# Patient Record
Sex: Female | Born: 1950 | Race: White | Hispanic: No | Marital: Married | Smoking: Never smoker
Health system: Southern US, Community
[De-identification: ages and names within clinical notes are randomized; demographics above are authoritative.]

## PROBLEM LIST (undated history)

## (undated) DIAGNOSIS — K219 Gastro-esophageal reflux disease without esophagitis: Secondary | ICD-10-CM

## (undated) DIAGNOSIS — E039 Hypothyroidism, unspecified: Secondary | ICD-10-CM

## (undated) DIAGNOSIS — E785 Hyperlipidemia, unspecified: Secondary | ICD-10-CM

## (undated) HISTORY — PX: TONSILLECTOMY: SUR1361

## (undated) HISTORY — PX: BREAST BIOPSY: SHX20

---

## 2002-11-27 ENCOUNTER — Other Ambulatory Visit: Admission: RE | Admit: 2002-11-27 | Discharge: 2002-11-27 | Payer: Self-pay | Admitting: *Deleted

## 2003-12-16 ENCOUNTER — Other Ambulatory Visit: Admission: RE | Admit: 2003-12-16 | Discharge: 2003-12-16 | Payer: Self-pay | Admitting: Obstetrics and Gynecology

## 2003-12-22 ENCOUNTER — Encounter: Admission: RE | Admit: 2003-12-22 | Discharge: 2003-12-22 | Payer: Self-pay | Admitting: Obstetrics and Gynecology

## 2004-12-27 ENCOUNTER — Other Ambulatory Visit: Admission: RE | Admit: 2004-12-27 | Discharge: 2004-12-27 | Payer: Self-pay | Admitting: Obstetrics and Gynecology

## 2004-12-28 ENCOUNTER — Encounter: Admission: RE | Admit: 2004-12-28 | Discharge: 2004-12-28 | Payer: Self-pay | Admitting: Obstetrics and Gynecology

## 2006-02-14 ENCOUNTER — Encounter: Admission: RE | Admit: 2006-02-14 | Discharge: 2006-02-14 | Payer: Self-pay | Admitting: Obstetrics and Gynecology

## 2007-02-28 ENCOUNTER — Encounter: Admission: RE | Admit: 2007-02-28 | Discharge: 2007-02-28 | Payer: Self-pay | Admitting: Obstetrics and Gynecology

## 2008-05-04 ENCOUNTER — Encounter: Admission: RE | Admit: 2008-05-04 | Discharge: 2008-05-04 | Payer: Self-pay | Admitting: Obstetrics and Gynecology

## 2009-06-10 ENCOUNTER — Encounter: Admission: RE | Admit: 2009-06-10 | Discharge: 2009-06-10 | Payer: Self-pay | Admitting: Obstetrics and Gynecology

## 2010-06-16 ENCOUNTER — Encounter: Admission: RE | Admit: 2010-06-16 | Discharge: 2010-06-16 | Payer: Self-pay | Admitting: Obstetrics and Gynecology

## 2010-10-16 ENCOUNTER — Encounter: Payer: Self-pay | Admitting: Obstetrics and Gynecology

## 2011-07-31 ENCOUNTER — Other Ambulatory Visit: Payer: Self-pay | Admitting: Family Medicine

## 2011-07-31 DIAGNOSIS — Z1231 Encounter for screening mammogram for malignant neoplasm of breast: Secondary | ICD-10-CM

## 2011-08-28 ENCOUNTER — Ambulatory Visit
Admission: RE | Admit: 2011-08-28 | Discharge: 2011-08-28 | Disposition: A | Discharge status: Home / Self Care | Payer: BC Managed Care – PPO | Source: Ambulatory Visit | Attending: Family Medicine | Admitting: Family Medicine

## 2011-08-28 DIAGNOSIS — Z1231 Encounter for screening mammogram for malignant neoplasm of breast: Secondary | ICD-10-CM

## 2012-09-11 ENCOUNTER — Other Ambulatory Visit: Payer: Self-pay | Admitting: Family Medicine

## 2012-09-11 DIAGNOSIS — Z1231 Encounter for screening mammogram for malignant neoplasm of breast: Secondary | ICD-10-CM

## 2012-10-14 ENCOUNTER — Ambulatory Visit
Admission: RE | Admit: 2012-10-14 | Discharge: 2012-10-14 | Disposition: A | Discharge status: Home / Self Care | Payer: BC Managed Care – PPO | Source: Ambulatory Visit | Attending: Family Medicine | Admitting: Family Medicine

## 2012-10-14 DIAGNOSIS — Z1231 Encounter for screening mammogram for malignant neoplasm of breast: Secondary | ICD-10-CM

## 2013-12-17 ENCOUNTER — Other Ambulatory Visit: Payer: Self-pay

## 2013-12-17 DIAGNOSIS — Z1231 Encounter for screening mammogram for malignant neoplasm of breast: Secondary | ICD-10-CM

## 2013-12-25 ENCOUNTER — Ambulatory Visit
Admission: RE | Admit: 2013-12-25 | Discharge: 2013-12-25 | Disposition: A | Discharge status: Home / Self Care | Payer: BC Managed Care – PPO | Source: Ambulatory Visit

## 2013-12-25 DIAGNOSIS — Z1231 Encounter for screening mammogram for malignant neoplasm of breast: Secondary | ICD-10-CM

## 2014-06-16 ENCOUNTER — Other Ambulatory Visit: Payer: Self-pay | Admitting: Endocrinology

## 2014-06-16 DIAGNOSIS — H61892 Other specified disorders of left external ear: Secondary | ICD-10-CM

## 2014-06-19 ENCOUNTER — Ambulatory Visit
Admission: RE | Admit: 2014-06-19 | Discharge: 2014-06-19 | Disposition: A | Discharge status: Home / Self Care | Payer: BC Managed Care – PPO | Source: Ambulatory Visit | Attending: Endocrinology | Admitting: Endocrinology

## 2014-06-19 DIAGNOSIS — H61892 Other specified disorders of left external ear: Secondary | ICD-10-CM

## 2014-06-24 ENCOUNTER — Other Ambulatory Visit: Payer: Self-pay | Admitting: Otolaryngology

## 2014-06-24 DIAGNOSIS — D490 Neoplasm of unspecified behavior of digestive system: Secondary | ICD-10-CM

## 2014-06-30 ENCOUNTER — Ambulatory Visit
Admission: RE | Admit: 2014-06-30 | Discharge: 2014-06-30 | Disposition: A | Discharge status: Home / Self Care | Payer: BC Managed Care – PPO | Source: Ambulatory Visit | Attending: Otolaryngology | Admitting: Otolaryngology

## 2014-06-30 DIAGNOSIS — D490 Neoplasm of unspecified behavior of digestive system: Secondary | ICD-10-CM

## 2014-06-30 MED ORDER — IOHEXOL 300 MG/ML  SOLN
75.0000 mL | Freq: Once | INTRAMUSCULAR | Status: AC | PRN
  Administered 2014-06-30: 75 mL via INTRAVENOUS

## 2014-09-28 ENCOUNTER — Inpatient Hospital Stay (HOSPITAL_COMMUNITY): Admission: RE | Admit: 2014-09-28 | Payer: BC Managed Care – PPO | Source: Ambulatory Visit

## 2014-10-17 NOTE — Pre-Procedure Instructions (Signed)
TENEISHA GIGNAC  10/17/2014   Your procedure is scheduled on:  January 29  Report to [facility] Admitting at 08:15 AM.  Call this number if you have problems the morning of surgery: [phone]   Remember:   Do not eat food or drink liquids after midnight.   Take these medicines the morning of surgery with A SIP OF WATER: Levothyroxine, Cabergoline (if due)   STOP Aspirin, Calcium, Vitamin D, Vitamin B12, Multiple Vitamins, Fish Oil, Zinc today   STOP/ Do not take Aspirin, Aleve, Naproxen, Advil, Ibuprofen, Motrin, Vitamins, Herbs, or Supplements starting today   Do not wear jewelry, make-up or nail polish.  Do not wear lotions, powders, or perfumes. You may wear deodorant.  Do not shave 48 hours prior to surgery. Men may shave face and neck.  Do not bring valuables to the hospital.  [facility] is not responsible for any belongings or valuables.               Contacts, dentures or bridgework may not be worn into surgery.  Leave suitcase in the car. After surgery it may be brought to your room.  For patients admitted to the hospital, discharge time is determined by your treatment team.               Patients discharged the day of surgery will not be allowed to drive home.    Special Instructions: [facility] - Preparing for Surgery  Before surgery, you can play an important role.  Because skin is not sterile, your skin needs to be as free of germs as possible.  You can reduce the number of germs on you skin by washing with CHG (chlorahexidine gluconate) soap before surgery.  CHG is an antiseptic cleaner which kills germs and bonds with the skin to continue killing germs even after washing.  Please DO NOT use if you have an allergy to CHG or antibacterial soaps.  If your skin becomes reddened/irritated stop using the CHG and inform your nurse when you arrive at Short Stay.  Do not shave (including legs and underarms) for at least 48 hours prior to the first CHG shower.   You may shave your face.  Please follow these instructions carefully:   1.  Shower with CHG Soap the night before surgery and the morning of Surgery.  2.  If you choose to wash your hair, wash your hair first as usual with your normal shampoo.  3.  After you shampoo, rinse your hair and body thoroughly to remove the shampoo.  4.  Use CHG as you would any other liquid soap.  You can apply CHG directly to the skin and wash gently with scrungie or a clean washcloth.  5.  Apply the CHG Soap to your body ONLY FROM THE NECK DOWN.  Do not use on open wounds or open sores.  Avoid contact with your eyes, ears, mouth and genitals (private parts).  Wash genitals (private parts) with your normal soap.  6.  Wash thoroughly, paying special attention to the area where your surgery will be performed.  7.  Thoroughly rinse your body with warm water from the neck down.  8.  DO NOT shower/wash with your normal soap after using and rinsing off the CHG Soap.  9.  Pat yourself dry with a clean towel.            10.  Wear clean pajamas.  11.  Place clean sheets on your bed the night of your first shower and do not sleep with pets.  Day of Surgery  Do not apply any lotions the morning of surgery.  Please wear clean clothes to the hospital/surgery center.     Please read over the following fact sheets that you were given: Pain Booklet, Coughing and Deep Breathing and Surgical Site Infection Prevention

## 2014-10-19 ENCOUNTER — Encounter (HOSPITAL_COMMUNITY)
Admission: RE | Admit: 2014-10-19 | Discharge: 2014-10-19 | Disposition: A | Discharge status: Home / Self Care | Payer: 59 | Source: Ambulatory Visit | Attending: Otolaryngology | Admitting: Otolaryngology

## 2014-10-19 ENCOUNTER — Encounter (HOSPITAL_COMMUNITY): Payer: Self-pay

## 2014-10-19 DIAGNOSIS — Z01812 Encounter for preprocedural laboratory examination: Secondary | ICD-10-CM | POA: Insufficient documentation

## 2014-10-19 DIAGNOSIS — D490 Neoplasm of unspecified behavior of digestive system: Secondary | ICD-10-CM | POA: Diagnosis not present

## 2014-10-19 HISTORY — DX: Hyperlipidemia, unspecified: E78.5

## 2014-10-19 HISTORY — DX: Hypothyroidism, unspecified: E03.9

## 2014-10-19 HISTORY — DX: Gastro-esophageal reflux disease without esophagitis: K21.9

## 2014-10-19 LAB — CBC
HEMATOCRIT: 43 % (ref 36.0–46.0)
Hemoglobin: 14 g/dL (ref 12.0–15.0)
MCH: 30.1 pg (ref 26.0–34.0)
MCHC: 32.6 g/dL (ref 30.0–36.0)
MCV: 92.5 fL (ref 78.0–100.0)
Platelets: 202 10*3/uL (ref 150–400)
RBC: 4.65 MIL/uL (ref 3.87–5.11)
RDW: 12.2 % (ref 11.5–15.5)
WBC: 4.7 10*3/uL (ref 4.0–10.5)

## 2014-10-22 NOTE — Anesthesia Preprocedure Evaluation (Addendum)
Anesthesia Evaluation  Patient identified by MRN, date of birth, ID band Patient awake    Reviewed: Allergy & Precautions, NPO status , Patient's Chart, lab work & pertinent test results, reviewed documented beta blocker date and time   Airway Mallampati: II   Neck ROM: Full    Dental  (+) Teeth Intact, Dental Advisory Given   Pulmonary neg pulmonary ROS,  breath sounds clear to auscultation        Cardiovascular negative cardio ROS  Rhythm:Regular     Neuro/Psych negative neurological ROS     GI/Hepatic Neg liver ROS, GERD-  Medicated,  Endo/Other  Hypothyroidism (replacement)   Renal/GU      Musculoskeletal   Abdominal (+)  Abdomen: soft.    Peds  Hematology   Anesthesia Other Findings   Reproductive/Obstetrics                           Anesthesia Physical Anesthesia Plan  ASA: II  Anesthesia Plan: General   Post-op Pain Management:    Induction: Intravenous  Airway Management Planned: Oral ETT  Additional Equipment:   Intra-op Plan:   Post-operative Plan: Extubation in OR  Informed Consent: I have reviewed the patients History and Physical, chart, labs and discussed the procedure including the risks, benefits and alternatives for the proposed anesthesia with the patient or authorized representative who has indicated his/her understanding and acceptance.     Plan Discussed with:   Anesthesia Plan Comments:         Anesthesia Quick Evaluation

## 2014-10-23 ENCOUNTER — Encounter (HOSPITAL_COMMUNITY)
Admission: RE | Disposition: A | Discharge status: Home / Self Care | Payer: Self-pay | Source: Ambulatory Visit | Attending: Otolaryngology

## 2014-10-23 ENCOUNTER — Ambulatory Visit (HOSPITAL_COMMUNITY): Payer: 59 | Admitting: Anesthesiology

## 2014-10-23 ENCOUNTER — Encounter (HOSPITAL_COMMUNITY): Payer: Self-pay | Admitting: Anesthesiology

## 2014-10-23 ENCOUNTER — Ambulatory Visit (HOSPITAL_COMMUNITY)
Admission: RE | Admit: 2014-10-23 | Discharge: 2014-10-24 | Disposition: A | Discharge status: Home / Self Care | Payer: 59 | Source: Ambulatory Visit | Attending: Otolaryngology | Admitting: Otolaryngology

## 2014-10-23 DIAGNOSIS — K219 Gastro-esophageal reflux disease without esophagitis: Secondary | ICD-10-CM | POA: Insufficient documentation

## 2014-10-23 DIAGNOSIS — K118 Other diseases of salivary glands: Secondary | ICD-10-CM | POA: Diagnosis present

## 2014-10-23 DIAGNOSIS — Z7982 Long term (current) use of aspirin: Secondary | ICD-10-CM | POA: Diagnosis not present

## 2014-10-23 DIAGNOSIS — E039 Hypothyroidism, unspecified: Secondary | ICD-10-CM | POA: Diagnosis not present

## 2014-10-23 DIAGNOSIS — Y838 Other surgical procedures as the cause of abnormal reaction of the patient, or of later complication, without mention of misadventure at the time of the procedure: Secondary | ICD-10-CM | POA: Insufficient documentation

## 2014-10-23 DIAGNOSIS — K9161 Intraoperative hemorrhage and hematoma of a digestive system organ or structure complicating a digestive sytem procedure: Secondary | ICD-10-CM | POA: Insufficient documentation

## 2014-10-23 DIAGNOSIS — E785 Hyperlipidemia, unspecified: Secondary | ICD-10-CM | POA: Insufficient documentation

## 2014-10-23 HISTORY — PX: HEMATOMA EVACUATION: SHX5118

## 2014-10-23 HISTORY — PX: PAROTIDECTOMY: SHX2163

## 2014-10-23 SURGERY — EVACUATION HEMATOMA
Anesthesia: General | Site: Neck | Laterality: Right

## 2014-10-23 SURGERY — EXCISION, PAROTID GLAND
Anesthesia: General | Site: Face | Laterality: Right

## 2014-10-23 MED ORDER — DEXTROSE IN LACTATED RINGERS 5 % IV SOLN
INTRAVENOUS | Status: DC
  Administered 2014-10-23: 20:00:00 via INTRAVENOUS

## 2014-10-23 MED ORDER — ZOLPIDEM TARTRATE 5 MG PO TABS: 5.0000 mg | ORAL_TABLET | Freq: Every evening | ORAL | Status: DC | PRN

## 2014-10-23 MED ORDER — ARTIFICIAL TEARS OP OINT
TOPICAL_OINTMENT | OPHTHALMIC | Status: AC
  Filled 2014-10-23: qty 3.5

## 2014-10-23 MED ORDER — PHENYLEPHRINE HCL 10 MG/ML IJ SOLN
INTRAMUSCULAR | Status: DC | PRN
  Administered 2014-10-23 (×3): 80 ug via INTRAVENOUS

## 2014-10-23 MED ORDER — SUCCINYLCHOLINE CHLORIDE 20 MG/ML IJ SOLN
INTRAMUSCULAR | Status: DC | PRN
  Administered 2014-10-23: 100 mg via INTRAVENOUS

## 2014-10-23 MED ORDER — CEFAZOLIN SODIUM-DEXTROSE 2-3 GM-% IV SOLR
2.0000 g | Freq: Three times a day (TID) | INTRAVENOUS | Status: AC
  Administered 2014-10-23 – 2014-10-24 (×2): 2 g via INTRAVENOUS
  Filled 2014-10-23 (×4): qty 50

## 2014-10-23 MED ORDER — ROCURONIUM BROMIDE 50 MG/5ML IV SOLN
INTRAVENOUS | Status: AC
  Filled 2014-10-23: qty 1

## 2014-10-23 MED ORDER — GLYCOPYRROLATE 0.2 MG/ML IJ SOLN
INTRAMUSCULAR | Status: DC | PRN
  Administered 2014-10-23: 0.4 mg via INTRAVENOUS
  Administered 2014-10-23: 0.2 mg via INTRAVENOUS

## 2014-10-23 MED ORDER — LIDOCAINE-EPINEPHRINE 1 %-1:100000 IJ SOLN
INTRAMUSCULAR | Status: AC
  Filled 2014-10-23: qty 1

## 2014-10-23 MED ORDER — ONDANSETRON HCL 4 MG PO TABS: 4.0000 mg | ORAL_TABLET | ORAL | Status: DC | PRN

## 2014-10-23 MED ORDER — HYDROCODONE-ACETAMINOPHEN 5-325 MG PO TABS: 1.0000 | ORAL_TABLET | Freq: Four times a day (QID) | ORAL | Status: DC | PRN

## 2014-10-23 MED ORDER — FENTANYL CITRATE 0.05 MG/ML IJ SOLN
INTRAMUSCULAR | Status: DC | PRN
  Administered 2014-10-23 (×5): 50 ug via INTRAVENOUS

## 2014-10-23 MED ORDER — HYDROCODONE-ACETAMINOPHEN 5-325 MG PO TABS
ORAL_TABLET | ORAL | Status: AC
  Filled 2014-10-23: qty 2

## 2014-10-23 MED ORDER — 0.9 % SODIUM CHLORIDE (POUR BTL) OPTIME
TOPICAL | Status: DC | PRN
  Administered 2014-10-23: 1000 mL

## 2014-10-23 MED ORDER — PROMETHAZINE HCL 25 MG/ML IJ SOLN: 12.5000 mg | Freq: Three times a day (TID) | INTRAMUSCULAR | Status: DC | PRN

## 2014-10-23 MED ORDER — LIDOCAINE HCL (CARDIAC) 20 MG/ML IV SOLN
INTRAVENOUS | Status: AC
  Filled 2014-10-23: qty 5

## 2014-10-23 MED ORDER — PHENYLEPHRINE HCL 10 MG/ML IJ SOLN
10.0000 mg | INTRAVENOUS | Status: DC | PRN
  Administered 2014-10-23: 30 ug/min via INTRAVENOUS

## 2014-10-23 MED ORDER — LEVOTHYROXINE SODIUM 50 MCG PO TABS
50.0000 ug | ORAL_TABLET | Freq: Every day | ORAL | Status: DC
  Administered 2014-10-24: 50 ug via ORAL
  Filled 2014-10-23 (×2): qty 1

## 2014-10-23 MED ORDER — MORPHINE SULFATE 2 MG/ML IJ SOLN: 2.0000 mg | INTRAMUSCULAR | Status: DC | PRN

## 2014-10-23 MED ORDER — CABERGOLINE 0.5 MG PO TABS: 0.5000 mg | ORAL_TABLET | ORAL | Status: DC

## 2014-10-23 MED ORDER — FENTANYL CITRATE 0.05 MG/ML IJ SOLN
INTRAMUSCULAR | Status: AC
  Filled 2014-10-23: qty 5

## 2014-10-23 MED ORDER — GLYCOPYRROLATE 0.2 MG/ML IJ SOLN
INTRAMUSCULAR | Status: AC
  Filled 2014-10-23: qty 6

## 2014-10-23 MED ORDER — HEPARIN SODIUM (PORCINE) 5000 UNIT/ML IJ SOLN: 5000.0000 [IU] | Freq: Three times a day (TID) | INTRAMUSCULAR | Status: DC

## 2014-10-23 MED ORDER — CALCIUM CITRATE-VITAMIN D 250-200 MG-UNIT PO TABS: 1.0000 | ORAL_TABLET | Freq: Every day | ORAL | Status: DC

## 2014-10-23 MED ORDER — ONDANSETRON HCL 4 MG/2ML IJ SOLN
INTRAMUSCULAR | Status: DC | PRN
  Administered 2014-10-23: 4 mg via INTRAVENOUS

## 2014-10-23 MED ORDER — FENTANYL CITRATE 0.05 MG/ML IJ SOLN: 25.0000 ug | INTRAMUSCULAR | Status: DC | PRN

## 2014-10-23 MED ORDER — CALCIUM CITRATE-VITAMIN D 500-400 MG-UNIT PO CHEW
1.0000 | CHEWABLE_TABLET | Freq: Every day | ORAL | Status: DC
  Filled 2014-10-23 (×2): qty 1

## 2014-10-23 MED ORDER — ACETAMINOPHEN 80 MG PO CHEW
320.0000 mg | CHEWABLE_TABLET | ORAL | Status: DC | PRN
  Filled 2014-10-23: qty 4

## 2014-10-23 MED ORDER — BACITRACIN ZINC 500 UNIT/GM EX OINT
TOPICAL_OINTMENT | CUTANEOUS | Status: DC | PRN
  Administered 2014-10-23: 1 via TOPICAL

## 2014-10-23 MED ORDER — PROPOFOL 10 MG/ML IV BOLUS
INTRAVENOUS | Status: DC | PRN
  Administered 2014-10-23: 140 mg via INTRAVENOUS

## 2014-10-23 MED ORDER — LIDOCAINE-EPINEPHRINE 1 %-1:100000 IJ SOLN
INTRAMUSCULAR | Status: DC | PRN
  Administered 2014-10-23: 20 mL

## 2014-10-23 MED ORDER — PROPOFOL 10 MG/ML IV BOLUS
INTRAVENOUS | Status: AC
  Filled 2014-10-23: qty 20

## 2014-10-23 MED ORDER — VITAMIN D 1000 UNITS PO CAPS: 1000.0000 [IU] | ORAL_CAPSULE | Freq: Every day | ORAL | Status: DC

## 2014-10-23 MED ORDER — ONDANSETRON HCL 4 MG/2ML IJ SOLN
INTRAMUSCULAR | Status: AC
  Filled 2014-10-23: qty 2

## 2014-10-23 MED ORDER — MIDAZOLAM HCL 2 MG/2ML IJ SOLN
INTRAMUSCULAR | Status: AC
  Filled 2014-10-23: qty 2

## 2014-10-23 MED ORDER — HYDROCODONE-ACETAMINOPHEN 5-325 MG PO TABS: 1.0000 | ORAL_TABLET | ORAL | Status: DC | PRN

## 2014-10-23 MED ORDER — MEPERIDINE HCL 25 MG/ML IJ SOLN: 6.2500 mg | INTRAMUSCULAR | Status: DC | PRN

## 2014-10-23 MED ORDER — ROCURONIUM BROMIDE 100 MG/10ML IV SOLN
INTRAVENOUS | Status: DC | PRN
  Administered 2014-10-23: 30 mg via INTRAVENOUS

## 2014-10-23 MED ORDER — LACTATED RINGERS IV SOLN
INTRAVENOUS | Status: DC
  Administered 2014-10-23 (×2): via INTRAVENOUS

## 2014-10-23 MED ORDER — EPHEDRINE SULFATE 50 MG/ML IJ SOLN
INTRAMUSCULAR | Status: DC | PRN
  Administered 2014-10-23 (×3): 10 mg via INTRAVENOUS

## 2014-10-23 MED ORDER — PHENOL 1.4 % MT LIQD: 2.0000 | Freq: Three times a day (TID) | OROMUCOSAL | Status: DC | PRN

## 2014-10-23 MED ORDER — DEXAMETHASONE SODIUM PHOSPHATE 4 MG/ML IJ SOLN
INTRAMUSCULAR | Status: AC
  Filled 2014-10-23: qty 4

## 2014-10-23 MED ORDER — MIDAZOLAM HCL 5 MG/5ML IJ SOLN
INTRAMUSCULAR | Status: DC | PRN
  Administered 2014-10-23 (×2): 1 mg via INTRAVENOUS

## 2014-10-23 MED ORDER — LIDOCAINE HCL (CARDIAC) 20 MG/ML IV SOLN
INTRAVENOUS | Status: DC | PRN
  Administered 2014-10-23: 60 mg via INTRAVENOUS

## 2014-10-23 MED ORDER — LIDOCAINE HCL (CARDIAC) 20 MG/ML IV SOLN
INTRAVENOUS | Status: DC | PRN
  Administered 2014-10-23: 100 mg via INTRAVENOUS

## 2014-10-23 MED ORDER — SUCCINYLCHOLINE CHLORIDE 20 MG/ML IJ SOLN
INTRAMUSCULAR | Status: AC
  Filled 2014-10-23: qty 3

## 2014-10-23 MED ORDER — ARTIFICIAL TEARS OP OINT
TOPICAL_OINTMENT | OPHTHALMIC | Status: DC | PRN
  Administered 2014-10-23: 1 via OPHTHALMIC

## 2014-10-23 MED ORDER — FENTANYL CITRATE 0.05 MG/ML IJ SOLN
INTRAMUSCULAR | Status: AC
  Filled 2014-10-23: qty 2

## 2014-10-23 MED ORDER — NEOSTIGMINE METHYLSULFATE 10 MG/10ML IV SOLN
INTRAVENOUS | Status: DC | PRN
  Administered 2014-10-23: 3 mg via INTRAVENOUS

## 2014-10-23 MED ORDER — PROMETHAZINE HCL 25 MG/ML IJ SOLN: 6.2500 mg | INTRAMUSCULAR | Status: DC | PRN

## 2014-10-23 MED ORDER — ONDANSETRON HCL 4 MG/2ML IJ SOLN
4.0000 mg | INTRAMUSCULAR | Status: DC | PRN
Start: 2014-10-23 — End: 2014-10-24

## 2014-10-23 MED ORDER — PHENYLEPHRINE 40 MCG/ML (10ML) SYRINGE FOR IV PUSH (FOR BLOOD PRESSURE SUPPORT)
PREFILLED_SYRINGE | INTRAVENOUS | Status: AC
  Filled 2014-10-23: qty 10

## 2014-10-23 MED ORDER — LACTATED RINGERS IV SOLN
INTRAVENOUS | Status: DC | PRN
  Administered 2014-10-23 (×2): via INTRAVENOUS

## 2014-10-23 MED ORDER — CEFAZOLIN SODIUM-DEXTROSE 2-3 GM-% IV SOLR
2000.0000 mg | Freq: Once | INTRAVENOUS | Status: AC
Start: 2014-10-23 — End: 2014-10-23
  Administered 2014-10-23: 2 g via INTRAVENOUS

## 2014-10-23 MED ORDER — ATORVASTATIN CALCIUM 40 MG PO TABS
40.0000 mg | ORAL_TABLET | Freq: Every day | ORAL | Status: DC
  Administered 2014-10-23: 40 mg via ORAL
  Filled 2014-10-23 (×2): qty 1

## 2014-10-23 MED ORDER — DEXTROSE IN LACTATED RINGERS 5 % IV SOLN: INTRAVENOUS | Status: DC

## 2014-10-23 MED ORDER — BACITRACIN ZINC 500 UNIT/GM EX OINT
TOPICAL_OINTMENT | CUTANEOUS | Status: AC
  Filled 2014-10-23: qty 28.35

## 2014-10-23 MED ORDER — FENTANYL CITRATE 0.05 MG/ML IJ SOLN
INTRAMUSCULAR | Status: DC | PRN
  Administered 2014-10-23: 100 ug via INTRAVENOUS

## 2014-10-23 MED ORDER — DEXAMETHASONE SODIUM PHOSPHATE 4 MG/ML IJ SOLN
INTRAMUSCULAR | Status: DC | PRN
Start: 2014-10-23 — End: 2014-10-23
  Administered 2014-10-23: 4 mg via INTRAVENOUS

## 2014-10-23 MED ORDER — HYDROCODONE-ACETAMINOPHEN 5-325 MG PO TABS
1.0000 | ORAL_TABLET | ORAL | Status: DC | PRN
  Administered 2014-10-23: 2 via ORAL

## 2014-10-23 MED ORDER — VITAMIN D3 25 MCG (1000 UNIT) PO TABS
1000.0000 [IU] | ORAL_TABLET | Freq: Every day | ORAL | Status: DC
  Filled 2014-10-23 (×2): qty 1

## 2014-10-23 SURGICAL SUPPLY — 47 items
BLADE SURG 15 STRL LF DISP TIS (BLADE) IMPLANT
BLADE SURG 15 STRL SS (BLADE) ×3
CATH ROBINSON RED A/P 12FR (CATHETERS) IMPLANT
CLEANER TIP ELECTROSURG 2X2 (MISCELLANEOUS) ×3 IMPLANT
COAGULATOR SUCT 6 FR SWTCH (ELECTROSURGICAL) ×1
COAGULATOR SUCT SWTCH 10FR 6 (ELECTROSURGICAL) ×2 IMPLANT
CORDS BIPOLAR (ELECTRODE) ×4 IMPLANT
COVER SURGICAL LIGHT HANDLE (MISCELLANEOUS) ×2 IMPLANT
DRAPE ORTHO SPLIT 77X108 STRL (DRAPES) ×3
DRAPE SURG ORHT 6 SPLT 77X108 (DRAPES) IMPLANT
ELECT COATED BLADE 2.86 ST (ELECTRODE) ×3 IMPLANT
ELECT REM PT RETURN 9FT ADLT (ELECTROSURGICAL)
ELECT REM PT RETURN 9FT PED (ELECTROSURGICAL)
ELECTRODE REM PT RETRN 9FT PED (ELECTROSURGICAL) IMPLANT
ELECTRODE REM PT RTRN 9FT ADLT (ELECTROSURGICAL) IMPLANT
EVACUATOR SILICONE 100CC (DRAIN) ×4 IMPLANT
GAUZE SPONGE 4X4 16PLY XRAY LF (GAUZE/BANDAGES/DRESSINGS) ×3 IMPLANT
GLOVE BIOGEL M 7.0 STRL (GLOVE) ×6 IMPLANT
GLOVE BIOGEL PI IND STRL 6 (GLOVE) IMPLANT
GLOVE BIOGEL PI IND STRL 6.5 (GLOVE) IMPLANT
GLOVE BIOGEL PI INDICATOR 6 (GLOVE) ×2
GLOVE BIOGEL PI INDICATOR 6.5 (GLOVE) ×2
GLOVE ECLIPSE 6.5 STRL STRAW (GLOVE) ×2 IMPLANT
GOWN STRL REUS W/ TWL LRG LVL3 (GOWN DISPOSABLE) ×2 IMPLANT
GOWN STRL REUS W/TWL LRG LVL3 (GOWN DISPOSABLE) ×6
KIT BASIN OR (CUSTOM PROCEDURE TRAY) ×3 IMPLANT
KIT ROOM TURNOVER OR (KITS) ×3 IMPLANT
LIQUID BAND (GAUZE/BANDAGES/DRESSINGS) ×2 IMPLANT
NS IRRIG 1000ML POUR BTL (IV SOLUTION) ×3 IMPLANT
PACK SURGICAL SETUP 50X90 (CUSTOM PROCEDURE TRAY) ×3 IMPLANT
PAD ARMBOARD 7.5X6 YLW CONV (MISCELLANEOUS) ×6 IMPLANT
PENCIL BUTTON HOLSTER BLD 10FT (ELECTRODE) ×3 IMPLANT
SPECIMEN JAR SMALL (MISCELLANEOUS) ×6 IMPLANT
SPONGE INTESTINAL PEANUT (DISPOSABLE) ×2 IMPLANT
SPONGE LAP 18X18 X RAY DECT (DISPOSABLE) ×2 IMPLANT
SPONGE LAP 4X18 X RAY DECT (DISPOSABLE) ×2 IMPLANT
SPONGE TONSIL 1 RF SGL (DISPOSABLE) ×3 IMPLANT
SUT ETHILON 3 0 PS 1 (SUTURE) ×2 IMPLANT
SUT ETHILON 4 0 PS 2 18 (SUTURE) ×2 IMPLANT
SUT VIC AB 5-0 PS2 18 (SUTURE) ×2 IMPLANT
SUT VICRYL 4-0 PS2 18IN ABS (SUTURE) ×2 IMPLANT
SYR BULB 3OZ (MISCELLANEOUS) ×3 IMPLANT
TOWEL OR 17X24 6PK STRL BLUE (TOWEL DISPOSABLE) ×6 IMPLANT
TOWEL OR 17X26 10 PK STRL BLUE (TOWEL DISPOSABLE) ×2 IMPLANT
TRAY CATH 16FR W/PLASTIC CATH (SET/KITS/TRAYS/PACK) ×2 IMPLANT
TUBE SALEM SUMP 16 FR W/ARV (TUBING) ×3 IMPLANT
WATER STERILE IRR 1000ML POUR (IV SOLUTION) ×3 IMPLANT

## 2014-10-23 SURGICAL SUPPLY — 55 items
ATTRACTOMAT 16X20 MAGNETIC DRP (DRAPES) IMPLANT
CANISTER SUCTION 2500CC (MISCELLANEOUS) ×3 IMPLANT
CLEANER TIP ELECTROSURG 2X2 (MISCELLANEOUS) ×3 IMPLANT
CONT SPEC 4OZ CLIKSEAL STRL BL (MISCELLANEOUS) ×3 IMPLANT
CORDS BIPOLAR (ELECTRODE) ×3 IMPLANT
COVER SURGICAL LIGHT HANDLE (MISCELLANEOUS) ×3 IMPLANT
DRAIN JACKSON RD 7FR 3/32 (WOUND CARE) ×2 IMPLANT
DRAIN PENROSE 1/4X12 LTX STRL (WOUND CARE) IMPLANT
DRAIN SNY 10 ROU (WOUND CARE) IMPLANT
DRAPE PROXIMA HALF (DRAPES) ×2 IMPLANT
DRAPE SURG 17X23 STRL (DRAPES) ×3 IMPLANT
DRSG TEGADERM 2-3/8X2-3/4 SM (GAUZE/BANDAGES/DRESSINGS) ×15 IMPLANT
ELECT COATED BLADE 2.86 ST (ELECTRODE) ×3 IMPLANT
ELECT PAIRED SUBDERMAL (MISCELLANEOUS) ×3
ELECT REM PT RETURN 9FT ADLT (ELECTROSURGICAL) ×3
ELECTRODE PAIRED SUBDERMAL (MISCELLANEOUS) IMPLANT
ELECTRODE REM PT RTRN 9FT ADLT (ELECTROSURGICAL) ×1 IMPLANT
EVACUATOR SILICONE 100CC (DRAIN) ×2 IMPLANT
GAUZE SPONGE 4X4 16PLY XRAY LF (GAUZE/BANDAGES/DRESSINGS) ×3 IMPLANT
GLOVE BIO SURGEON STRL SZ 6.5 (GLOVE) ×1 IMPLANT
GLOVE BIO SURGEON STRL SZ7.5 (GLOVE) ×2 IMPLANT
GLOVE BIO SURGEON STRL SZ8 (GLOVE) ×4 IMPLANT
GLOVE BIO SURGEONS STRL SZ 6.5 (GLOVE) ×1
GLOVE BIOGEL M 7.0 STRL (GLOVE) ×3 IMPLANT
GLOVE BIOGEL PI IND STRL 8 (GLOVE) IMPLANT
GLOVE BIOGEL PI INDICATOR 8 (GLOVE) ×4
GOWN STRL REUS W/ TWL LRG LVL3 (GOWN DISPOSABLE) ×2 IMPLANT
GOWN STRL REUS W/ TWL XL LVL3 (GOWN DISPOSABLE) IMPLANT
GOWN STRL REUS W/TWL LRG LVL3 (GOWN DISPOSABLE) ×6
GOWN STRL REUS W/TWL XL LVL3 (GOWN DISPOSABLE) ×3
KIT BASIN OR (CUSTOM PROCEDURE TRAY) ×3 IMPLANT
KIT ROOM TURNOVER OR (KITS) ×3 IMPLANT
LIQUID BAND (GAUZE/BANDAGES/DRESSINGS) ×2 IMPLANT
NEEDLE 27GAX1X1/2 (NEEDLE) ×2 IMPLANT
NS IRRIG 1000ML POUR BTL (IV SOLUTION) ×3 IMPLANT
PAD ARMBOARD 7.5X6 YLW CONV (MISCELLANEOUS) ×6 IMPLANT
PENCIL BUTTON HOLSTER BLD 10FT (ELECTRODE) ×3 IMPLANT
PROBE NERVBE PRASS .33 (MISCELLANEOUS) ×2 IMPLANT
SHEARS HARMONIC 9CM CVD (BLADE) ×3 IMPLANT
SPONGE INTESTINAL PEANUT (DISPOSABLE) IMPLANT
STAPLER VISISTAT 35W (STAPLE) ×3 IMPLANT
SUT ETHILON 3 0 PS 1 (SUTURE) ×2 IMPLANT
SUT ETHILON 5 0 P 3 18 (SUTURE)
SUT ETHILON 6 0 P 1 (SUTURE) IMPLANT
SUT NYLON ETHILON 5-0 P-3 1X18 (SUTURE) IMPLANT
SUT SILK 2 0 FS (SUTURE) ×4 IMPLANT
SUT SILK 2 0 REEL (SUTURE) ×3 IMPLANT
SUT SILK 3 0 REEL (SUTURE) ×3 IMPLANT
SUT VIC AB 5-0 P-3 18XBRD (SUTURE) ×1 IMPLANT
SUT VIC AB 5-0 P3 18 (SUTURE)
SUT VICRYL 4-0 PS2 18IN ABS (SUTURE) ×3 IMPLANT
TOWEL OR 17X26 10 PK STRL BLUE (TOWEL DISPOSABLE) ×3 IMPLANT
TRAY ENT MC OR (CUSTOM PROCEDURE TRAY) ×3 IMPLANT
TUBE ENDOTRACH  EMG 6MMTUBE EN (MISCELLANEOUS)
TUBE ENDOTRACH EMG 6MMTUBE EN (MISCELLANEOUS) IMPLANT

## 2014-10-23 NOTE — Transfer of Care (Signed)
Immediate Anesthesia Transfer of Care Note  Patient: [name]  Procedure(s) Performed: Procedure(s): EVACUATION HEMATOMA LEFT NECK (Right)  Patient Location: PACU  Anesthesia Type:General  Level of Consciousness: awake, alert  and oriented  Airway & Oxygen Therapy: Patient connected to face mask oxygen  Post-op Assessment: Report given to RN  Post vital signs: stable  Last Vitals:  Filed Vitals:   10/23/14 1315  BP:   Pulse: 77  Temp:   Resp: 16    Complications: No apparent anesthesia complications

## 2014-10-23 NOTE — Anesthesia Preprocedure Evaluation (Addendum)
Anesthesia Evaluation  Patient identified by MRN, date of birth, ID band Patient awake    Reviewed: Allergy & Precautions, NPO status , Patient's Chart, lab work & pertinent test results  Airway Mallampati: II  TM Distance: >3 FB Neck ROM: Full    Dental  (+) Teeth Intact   Pulmonary          Cardiovascular     Neuro/Psych    GI/Hepatic GERD-  Controlled and Medicated,  Endo/Other  Hypothyroidism   Renal/GU      Musculoskeletal   Abdominal   Peds  Hematology   Anesthesia Other Findings   Reproductive/Obstetrics                            Anesthesia Physical Anesthesia Plan  ASA: II  Anesthesia Plan: General   Post-op Pain Management:    Induction: Intravenous  Airway Management Planned: Oral ETT  Additional Equipment:   Intra-op Plan:   Post-operative Plan: Extubation in OR  Informed Consent: I have reviewed the patients History and Physical, chart, labs and discussed the procedure including the risks, benefits and alternatives for the proposed anesthesia with the patient or authorized representative who has indicated his/her understanding and acceptance.   Dental advisory given  Plan Discussed with: CRNA and Surgeon  Anesthesia Plan Comments:        Anesthesia Quick Evaluation

## 2014-10-23 NOTE — Anesthesia Procedure Notes (Signed)
Procedure Name: Intubation Date/Time: 10/23/2014 10:59 AM Performed by: Susa Loffler Pre-anesthesia Checklist: Patient identified, Patient being monitored, Emergency Drugs available, Timeout performed and Suction available Patient Re-evaluated:Patient Re-evaluated prior to inductionOxygen Delivery Method: Circle system utilized Preoxygenation: Pre-oxygenation with 100% oxygen Intubation Type: IV induction Ventilation: Mask ventilation without difficulty Laryngoscope Size: Mac and 3 Grade View: Grade I Tube type: Oral Tube size: 7.0 mm Number of attempts: 1 Airway Equipment and Method: Stylet Placement Confirmation: ETT inserted through vocal cords under direct vision,  positive ETCO2 and breath sounds checked- equal and bilateral Secured at: 21 cm Tube secured with: Tape Dental Injury: Teeth and Oropharynx as per pre-operative assessment

## 2014-10-23 NOTE — Brief Op Note (Signed)
10/23/2014  3:29 PM  PATIENT:  [name]  64 y.o. female  PRE-OPERATIVE DIAGNOSIS:  hematoma  POST-OPERATIVE DIAGNOSIS:  hematoma  PROCEDURE:  Procedure(s): EVACUATION HEMATOMA LEFT NECK (Right)  SURGEON:  Surgeon(s) and Role:    * Jerrell Belfast, MD - Primary  PHYSICIAN ASSISTANT:   ASSISTANTS: none   ANESTHESIA:   general  EBL:  Total I/O In: 1400 [I.V.:1400] Out: 2122 [Urine:1000; Blood:25]  BLOOD ADMINISTERED:none  DRAINS: (7 Fr) Jackson-Pratt drain(s) with closed bulb suction in the Rt parotid   LOCAL MEDICATIONS USED:  NONE  SPECIMEN:  No Specimen  DISPOSITION OF SPECIMEN:  N/A  COUNTS:  YES  TOURNIQUET:  * No tourniquets in log *  DICTATION: .Other Dictation: Dictation Number [id number]  PLAN OF CARE: Admit for overnight observation  PATIENT DISPOSITION:  PACU - hemodynamically stable.   Delay start of Pharmacological VTE agent (>24hrs) due to surgical blood loss or risk of bleeding: no

## 2014-10-23 NOTE — Transfer of Care (Signed)
Immediate Anesthesia Transfer of Care Note  Patient: [name]  Procedure(s) Performed: Procedure(s): RIGHT SUPERFICIAL PAROTIDECTOMY (Right)  Patient Location: PACU  Anesthesia Type:General  Level of Consciousness: awake, alert  and oriented  Airway & Oxygen Therapy: Patient Spontanous Breathing and Patient connected to nasal cannula oxygen  Post-op Assessment: Report given to RN and Post -op Vital signs reviewed and stable  Post vital signs: Reviewed and stable  Last Vitals:  Filed Vitals:   10/23/14 0853  BP: 177/84  Pulse: 76  Temp: 36.7 C  Resp: 20    Complications: No apparent anesthesia complications

## 2014-10-23 NOTE — Progress Notes (Signed)
Arrived in pacu found crna with patient awaiting staff to give report to

## 2014-10-23 NOTE — Brief Op Note (Signed)
10/23/2014  12:41 PM  PATIENT:  [name]  64 y.o. female  PRE-OPERATIVE DIAGNOSIS:  Right parotid mass  POST-OPERATIVE DIAGNOSIS:  Right parotid mass  PROCEDURE:  Procedure(s): RIGHT SUPERFICIAL PAROTIDECTOMY (Right)  SURGEON:  Surgeon(s) and Role:    * Jerrell Belfast, MD - Primary  PHYSICIAN ASSISTANT:   ASSISTANTS: Jolene Provost, PA   ANESTHESIA:   general  EBL:  Total I/O In: 1300 [I.V.:1300] Out: 25 [Blood:25]  BLOOD ADMINISTERED:none  DRAINS: (7 Fr) Jackson-Pratt drain(s) with closed bulb suction in the Rt Neck   LOCAL MEDICATIONS USED:  LIDOCAINE  and Amount: 5 ml  SPECIMEN:  Source of Specimen:  Rt Parotid  DISPOSITION OF SPECIMEN:  PATHOLOGY  COUNTS:  YES  TOURNIQUET:  * No tourniquets in log *  DICTATION: .Other Dictation: Dictation Number [id number]  PLAN OF CARE: Admit for overnight observation  PATIENT DISPOSITION:  PACU - hemodynamically stable.   Delay start of Pharmacological VTE agent (>24hrs) due to surgical blood loss or risk of bleeding: no

## 2014-10-23 NOTE — H&P (Signed)
GISEL VIPOND is an 64 y.o. female.   Chief Complaint: Right parotid mass HPI: Gradually enlarging rt parotid mass  Past Medical History  Diagnosis Date  . Hypothyroidism   . GERD (gastroesophageal reflux disease)     occasionally,uses tums  . Hyperlipidemia     Past Surgical History  Procedure Laterality Date  . Tonsillectomy      No family history on file. Social History:  reports that she has never smoked. She does not have any smokeless tobacco history on file. She reports that she drinks about 2.4 oz of alcohol per week. Her drug history is not on file.  Allergies: No Known Allergies  Medications Prior to Admission  Medication Sig Dispense Refill  . aspirin 81 MG tablet Take 81 mg by mouth daily.    Marland Kitchen atorvastatin (LIPITOR) 40 MG tablet Take 40 mg by mouth daily.  9  . cabergoline (DOSTINEX) 0.5 MG tablet Take 0.5 mg by mouth once a week.  7  . Calcium Citrate-Vitamin D 250-200 MG-UNIT TABS Take 1 tablet by mouth daily.     . Cholecalciferol (VITAMIN D) 1000 UNITS capsule Take 1,000 Units by mouth daily.     . Cyanocobalamin (VITAMIN B-12 PO) Take 1 tablet by mouth 3 (three) times daily as needed.     Marland Kitchen levothyroxine (SYNTHROID, LEVOTHROID) 50 MCG tablet Take 50 mcg by mouth daily before breakfast.   10  . Multiple Vitamins-Minerals (ZINC PO) Take 1 tablet by mouth daily.    . Omega-3 Fatty Acids (FISH OIL) 1000 MG CAPS Take 2 capsules by mouth daily.     . Zinc 50 MG CAPS Take 1 capsule by mouth daily.      No results found for this or any previous visit (from the past 48 hour(s)). No results found.  Review of Systems  Constitutional: Negative.   HENT: Negative.   Respiratory: Negative.   Cardiovascular: Negative.   Gastrointestinal: Negative.     Blood pressure 177/84, pulse 76, temperature 98.1 F (36.7 C), temperature source Oral, resp. rate 20, weight 61.236 kg (135 lb), SpO2 99 %. Physical Exam  Constitutional: She is oriented to person, place, and time.  She appears well-developed and well-nourished.  Neck: Normal range of motion. Neck supple.  Right parotid mass  Cardiovascular: Normal rate.   Respiratory: Effort normal.  GI: Soft.  Musculoskeletal: Normal range of motion.  Neurological: She is alert and oriented to person, place, and time.     Assessment/Plan Adm for OP right parotidectomy with facial nerve dissection, plan o/n obs  Shannelle Alguire 10/23/2014, 10:26 AM

## 2014-10-23 NOTE — Anesthesia Procedure Notes (Signed)
Procedure Name: Intubation Date/Time: 10/23/2014 2:06 PM Performed by: Rush Farmer E Pre-anesthesia Checklist: Patient identified, Emergency Drugs available, Suction available, Patient being monitored and Timeout performed Patient Re-evaluated:Patient Re-evaluated prior to inductionOxygen Delivery Method: Circle system utilized Preoxygenation: Pre-oxygenation with 100% oxygen Intubation Type: IV induction and Rapid sequence Laryngoscope Size: Mac and 3 Grade View: Grade I Tube type: Oral Tube size: 7.0 mm Number of attempts: 1 Airway Equipment and Method: Stylet Placement Confirmation: ETT inserted through vocal cords under direct vision,  positive ETCO2 and breath sounds checked- equal and bilateral Secured at: 21 cm Tube secured with: Tape Dental Injury: Teeth and Oropharynx as per pre-operative assessment

## 2014-10-23 NOTE — Progress Notes (Signed)
Subjective: POD#0 from right superficial parotidectomy. Taken back post-op for right neck hematoma.  Objective: Vital signs in last 24 hours: Temp:  [97.7 F (36.5 C)-98.7 F (37.1 C)] 98.1 F (36.7 C) (01/29 1611) Pulse Rate:  [72-94] 94 (01/29 1536) Resp:  [14-21] 16 (01/29 1611) BP: (135-177)/(61-84) 135/61 mmHg (01/29 1611) SpO2:  [95 %-100 %] 95 % (01/29 1611) Weight:  [61.236 kg (135 lb)] 61.236 kg (135 lb) (01/29 1700)  Right earlobe and neck with some mild ecchymosis but no expanding hematoma and neck soft. Right neck drain holding bulb suction. Facial nerve House-Brackmann 1/6 bilaterally in all divisions.  @LABLAST2 (wbc:2,hgb:2,hct:2,plt:2) No results for input(s): NA, K, CL, CO2, GLUCOSE, BUN, CREATININE, CALCIUM in the last 72 hours.  Medications:  Scheduled Meds: . atorvastatin  40 mg Oral Daily  . cabergoline  0.5 mg Oral Weekly  . calcium citrate-vitamin D  1 tablet Oral Daily  .  ceFAZolin (ANCEF) IV  2 g Intravenous Q8H  . cholecalciferol  1,000 Units Oral Daily  . fentaNYL      . HYDROcodone-acetaminophen      . [START ON 10/24/2014] levothyroxine  50 mcg Oral QAC breakfast   Continuous Infusions: . dextrose 5% lactated ringers     PRN Meds:.acetaminophen, HYDROcodone-acetaminophen, morphine injection, ondansetron **OR** ondansetron (ZOFRAN) IV, phenol, promethazine, zolpidem  Assessment/Plan: S/p right superficial parotidectomy and takeback for hematoma. Doing well. Will monitor JP output and can go home when JP output low enough. I d/c'd subcutaneous heparin and will use SCDs for DVT prophylaxis given recent hematoma.   LOS: 0 days   Ruby Cola 10/23/2014, 5:23 PM

## 2014-10-23 NOTE — Anesthesia Postprocedure Evaluation (Signed)
Anesthesia Post Note  Patient: [name]  Procedure(s) Performed: Procedure(s) (LRB): EVACUATION HEMATOMA LEFT NECK (Right)  Anesthesia type: general  Patient location: PACU  Post pain: Pain level controlled  Post assessment: Patient's Cardiovascular Status Stable  Last Vitals:  Filed Vitals:   10/23/14 1611  BP: 135/61  Pulse:   Temp: 36.7 C  Resp: 16    Post vital signs: Reviewed and stable  Level of consciousness: sedated  Complications: No apparent anesthesia complications

## 2014-10-23 NOTE — Anesthesia Postprocedure Evaluation (Signed)
  Anesthesia Post-op Note  Patient: [name]  Procedure(s) Performed: Procedure(s): RIGHT SUPERFICIAL PAROTIDECTOMY (Right)  Patient Location: PACU  Anesthesia Type:General  Level of Consciousness: awake and alert   Airway and Oxygen Therapy: Patient Spontanous Breathing and Patient connected to nasal cannula oxygen  Post-op Pain: mild  Post-op Assessment: Post-op Vital signs reviewed and Patient's Cardiovascular Status Stable  Post-op Vital Signs: Reviewed  Last Vitals:  Filed Vitals:   10/23/14 1315  BP:   Pulse: 77  Temp:   Resp: 16    Complications: No apparent anesthesia complications

## 2014-10-24 DIAGNOSIS — K118 Other diseases of salivary glands: Secondary | ICD-10-CM | POA: Diagnosis not present

## 2014-10-24 MED ORDER — ONDANSETRON HCL 4 MG PO TABS: 4.0000 mg | ORAL_TABLET | ORAL | Status: DC | PRN

## 2014-10-24 NOTE — Discharge Summary (Signed)
10/24/2014  6:40 AM  Date of Admission: 10/23/2014 Date of Discharge: 10/24/2014  Discharge MD: Ruby Cola, MD  Admitting MD: Jerrell Belfast, MD  Reason for admission/final discharge diagnosis: right parotid mass s/p parotidectomy  Labs: see EPIC  Procedure(s) performed: right superficial parotidectomy and takeback for washout of hematoma post-op  Discharge Condition: good  Discharge Exam: neck supple, JP removed, facial nerve House-Brackmann 1/6 bilaterally in all divisions, no hematoma noted  Discharge Instructions: can gently clean incision after 24 hours with soap and water, follow up with  Dr. Wilburn Cornelia At [facility] as scheduled (~ 10 days), Rx for hydrocodone on chart  Hospital Course: had superficial parotidectomy 10/23/14, had hematoma evacuated post-op but otherwise did well post-op, drain removed and D/C'd on POD#1.  Ruby Cola 6:40 AM 10/24/2014

## 2014-10-24 NOTE — Discharge Instructions (Signed)
can gently clean incision after 24 hours with soap and water, follow up with  Dr. Wilburn Cornelia At [facility] as scheduled (~ 10 days), Rx for hydrocodone on chart

## 2014-10-24 NOTE — Op Note (Signed)
[name], NUDELMAN NO.:  [id number]  MEDICAL RECORD NO.:  [id number]  LOCATION:  [id number]                        FACILITY:  [facility]  PHYSICIAN:  Early Chars. Wilburn Cornelia, M.D.DATE OF BIRTH:  Dec 08, 1950  DATE OF PROCEDURE:  10/23/2014 DATE OF DISCHARGE:                              OPERATIVE REPORT   PREOPERATIVE DIAGNOSIS:  Right parotid mass.  POSTOPERATIVE DIAGNOSIS:  Right parotid mass.  SURGICAL PROCEDURE:  Right superficial parotidectomy with facial nerve dissection.  ANESTHESIA:  General endotracheal.  SURGEON:  Early Chars. Wilburn Cornelia, M.D.  ASSISTANT:  Jolene Provost, PA-C.  COMPLICATIONS:  No complications.  BLOOD LOSS:  Loss less than 50 mL.  DISPOSITION:  The patient transferred from the operating room to the recovery room in stable condition.  BRIEF HISTORY:  The patient is a 64 year old white female who was referred to our office for evaluation of an asymptomatic mass in the right cheek.  Evaluation in the office revealed a 1 cm firm nodular mass within the parotid gland.  Findings were consistent with right superficial parotid mass and given the patient's history and findings, we discussed treatment options including observation versus surgical excision.  The patient and her family understood the risks and benefits of the procedure and opted to proceed with right superficial parotidectomy with facial nerve dissection.  DESCRIPTION OF PROCEDURE:  The patient was brought to the operating room on October 23, 2014, and placed in a supine position on the operating table.  General endotracheal anesthesia was established without difficulty.  When the patient was adequately anesthetized, she was positioned and prepped and draped.  The [facility] monitor was used throughout the facial nerve dissection portion of the case.  The NIMS probe was then placed at the orbicularis oculi and orbicularis auris muscles respectively.  The patient was injected with a  total of 5 mL of 1% lidocaine with 1:100,000 solution of epinephrine was injected in a subcutaneous fashion in a proposed surgical incision.  The patient was then positioned and surgery was undertaken.  When the patient was prepared for surgery, a curvilinear incision was created beginning in the right preauricular crease and extending into the infra-auricular region and curving into the upper neck, this was carried through the skin and underlying subcutaneous tissue using a #15 scalpel blade.  Bovie electrocautery was then used to dissect and elevate the incision line superiorly.  The tragal cartilage was used as an anatomic landmark.  The lateral parotid fascia was identified and skin, muscle, and fat were then elevated anteriorly and dissection was carried along the parotid fascia preserving the overlying soft tissues without injury.  Posterior and inferior the anterior border of sternocleidomastoid muscles were identified and this was followed from superior to inferior and then deep along the anterior border elevating the parotid tissue anteriorly.  Dissection was then carried out along the posterior aspect of the parotid gland reflecting the gland itself inferiorly.  Dissection was carried from superficial to deep to identify the main trunk of the facial nerve, which was identified.  The superior and inferior divisions were then identified and the entire inferior division was carefully dissected as the mass was in the posterior inferior component of  the gland using blunt and sharp dissection and Harmonic Scalpel for hemostasis.  The superficial component of the parotid gland was then reflected anteriorly superficial to the level of the facial nerve.  The superior division of the nerve was then dissected and the entire superficial component of the parotid gland including the tumor was removed and sent to Pathology for gross microscopic evaluation.  Hemostasis was maintained with a  combination of suture ligature and bipolar cautery.  The common facial vein had been crossed and was suture ligated.  There was no bleeding.  The wound was carefully irrigated.  A 7-French round drain was then placed at the base of the incision and was sutured to the skin with 4-0 Ethilon suture.  The entire incision was then carefully closed in layers beginning with reapproximation of the periparotid fascia with interrupted 4-0 Vicryl suture, deep subcutaneous closure with 5-0 Vicryl suture, and final skin edge closure with Dermabond surgical glue.  Prior to closure, the facial nerve was stimulated using the NIMS probe and all branches were functional.  The patient was then awakened from her anesthetic.  She was extubated and transferred from the operating room to the recovery room in stable condition.  There were no complications.  The blood loss was less than 50 mL.          ______________________________ Early Chars. Wilburn Cornelia, M.D.     DLS/MEDQ  D:  [id number]  T:  10/24/2014  Job:  [id number]

## 2014-10-24 NOTE — Op Note (Signed)
[name], [name] NO.:  [id number]  MEDICAL RECORD NO.:  [id number]  LOCATION:  6N06C                        FACILITY:  [facility]  PHYSICIAN:  Early Chars. Wilburn Cornelia, M.D.DATE OF BIRTH:  11-20-1950  DATE OF PROCEDURE:  10/23/2014 DATE OF DISCHARGE:                              OPERATIVE REPORT   PREOPERATIVE DIAGNOSIS:  Postoperative hemorrhage.  POSTOPERATIVE DIAGNOSIS:  Postoperative hemorrhage.  INDICATION:  Postoperative hemorrhage.  SURGICAL PROCEDURE:  Exploration and control of postoperative hemorrhage after right superficial parotidectomy.  ANESTHESIA:  General endotracheal.  SURGEON:  Early Chars. Wilburn Cornelia, M.D.  COMPLICATIONS:  None.  ESTIMATED BLOOD LOSS:  Approximately 100 mL.  DISPOSITION:  Patient transferred from the operating room to the recovery room in stable condition.  BRIEF HISTORY:  The patient is an otherwise healthy 64 year old white female, with a right parotid mass.  She underwent right superficial parotidectomy without complication and minimal intraoperative bleeding. The patient was transferred from the operating room to the recovery room; and when in recovery, she was noted to have a significant amount of swelling in the right periparotid region.  Her drain was not effectively removing the clotted material and examination showed significant swelling.  There was no facial nerve dysfunction, and the patient was hemodynamically stable.  Given the findings, I recommended to undertake exploration of the patient's wound, cautery, and control of hemorrhage.  The risks and benefits were discussed with the patient and her husband.  They understood and agreed with our plan for surgery which is scheduled on emergency basis.  DESCRIPTION OF PROCEDURE:  The patient was brought to the operating room, [facility], on emergency basis for exploration and control of right periparotid hemorrhage after previous superficial parotidectomy.   The patient underwent general anesthesia without difficulty.  When she was adequately anesthetized and intubated and her airway was controlled, she was positioned on the operating table.  The previous incision was treated with bacitracin ointment to dissolve the surgical glue which was used for closured.  The patient was then prepped and draped in position, and the surgical procedure was begun.  Previous surgical glue was easily removed after application of antibiotic ointment.  Deep subcutaneous sutures were cut and the skin flap was elevated anteriorly.  There was a large amount of clot approximately 50 mL which was dissected from the wound bed.  Area was thoroughly irrigated.  Facial nerve was intact.  There were several small areas of bleeding, particular on the posterior aspect of the flap; and inferiorly, these were cauterized with bipolar cautery.  Valsalva maneuver was undertaken.  The pressure was elevated.  There was no active bleeding.  The wound was then thoroughly irrigated and reexamined.  No other bleeding sites were noted.  After secondary inspection and repeat Valsalva maneuver, no active bleeding was encountered.  A 7-French round drain was placed into the depths of the incision and was sutured to the skin with a 4-0 Ethilon suture.  The skin flaps were then reapproximated beginning with deep closure of the periparotid fascia with interrupted 4-0 Vicryl.  Immediate subcutaneous tissue closed with 5-0 interrupted Vicryl, and the skin edges closed with Dermabond surgical glue.  The patient was  then awakened from the anesthetic and extubated, there was no swelling and no evidence of recurrent hematoma.  She was then transferred from the operating room to the recovery room in stable condition.          ______________________________ Early Chars Wilburn Cornelia, M.D.     DLS/MEDQ  D:  81/85/9093  T:  10/24/2014  Job:  [id number]

## 2014-10-26 ENCOUNTER — Encounter (HOSPITAL_COMMUNITY): Payer: Self-pay | Admitting: Otolaryngology

## 2014-12-22 ENCOUNTER — Other Ambulatory Visit: Payer: Self-pay

## 2014-12-22 DIAGNOSIS — Z1231 Encounter for screening mammogram for malignant neoplasm of breast: Secondary | ICD-10-CM

## 2014-12-30 ENCOUNTER — Other Ambulatory Visit: Payer: Self-pay | Admitting: Obstetrics and Gynecology

## 2015-01-01 ENCOUNTER — Ambulatory Visit
Admission: RE | Admit: 2015-01-01 | Discharge: 2015-01-01 | Disposition: A | Discharge status: Home / Self Care | Payer: 59 | Source: Ambulatory Visit

## 2015-01-01 ENCOUNTER — Encounter (INDEPENDENT_AMBULATORY_CARE_PROVIDER_SITE_OTHER): Payer: Self-pay

## 2015-01-01 DIAGNOSIS — Z1231 Encounter for screening mammogram for malignant neoplasm of breast: Secondary | ICD-10-CM

## 2015-01-01 LAB — CYTOLOGY - PAP

## 2015-11-23 ENCOUNTER — Other Ambulatory Visit: Payer: Self-pay

## 2015-11-23 DIAGNOSIS — Z1231 Encounter for screening mammogram for malignant neoplasm of breast: Secondary | ICD-10-CM

## 2016-01-21 ENCOUNTER — Ambulatory Visit
Admission: RE | Admit: 2016-01-21 | Discharge: 2016-01-21 | Disposition: A | Discharge status: Home / Self Care | Payer: BLUE CROSS/BLUE SHIELD | Source: Ambulatory Visit

## 2016-01-21 DIAGNOSIS — Z1231 Encounter for screening mammogram for malignant neoplasm of breast: Secondary | ICD-10-CM

## 2016-01-25 ENCOUNTER — Other Ambulatory Visit: Payer: Self-pay | Admitting: Obstetrics and Gynecology

## 2016-01-25 DIAGNOSIS — R928 Other abnormal and inconclusive findings on diagnostic imaging of breast: Secondary | ICD-10-CM

## 2016-02-01 ENCOUNTER — Ambulatory Visit
Admission: RE | Admit: 2016-02-01 | Discharge: 2016-02-01 | Disposition: A | Discharge status: Home / Self Care | Payer: BLUE CROSS/BLUE SHIELD | Source: Ambulatory Visit | Attending: Obstetrics and Gynecology | Admitting: Obstetrics and Gynecology

## 2016-02-01 DIAGNOSIS — R928 Other abnormal and inconclusive findings on diagnostic imaging of breast: Secondary | ICD-10-CM

## 2016-07-13 ENCOUNTER — Other Ambulatory Visit: Payer: Self-pay | Admitting: Obstetrics and Gynecology

## 2016-07-13 DIAGNOSIS — N6320 Unspecified lump in the left breast, unspecified quadrant: Secondary | ICD-10-CM

## 2016-08-04 ENCOUNTER — Other Ambulatory Visit: Payer: BLUE CROSS/BLUE SHIELD

## 2016-08-08 ENCOUNTER — Ambulatory Visit
Admission: RE | Admit: 2016-08-08 | Discharge: 2016-08-08 | Disposition: A | Discharge status: Home / Self Care | Payer: BLUE CROSS/BLUE SHIELD | Source: Ambulatory Visit | Attending: Obstetrics and Gynecology | Admitting: Obstetrics and Gynecology

## 2016-08-08 ENCOUNTER — Other Ambulatory Visit: Payer: Self-pay | Admitting: Obstetrics and Gynecology

## 2016-08-08 DIAGNOSIS — N6320 Unspecified lump in the left breast, unspecified quadrant: Secondary | ICD-10-CM

## 2017-02-27 ENCOUNTER — Other Ambulatory Visit: Payer: Self-pay | Admitting: Obstetrics and Gynecology

## 2017-02-27 DIAGNOSIS — N63 Unspecified lump in unspecified breast: Secondary | ICD-10-CM

## 2017-03-12 ENCOUNTER — Ambulatory Visit
Admission: RE | Admit: 2017-03-12 | Discharge: 2017-03-12 | Disposition: A | Discharge status: Home / Self Care | Payer: BLUE CROSS/BLUE SHIELD | Source: Ambulatory Visit | Attending: Obstetrics and Gynecology | Admitting: Obstetrics and Gynecology

## 2017-03-12 DIAGNOSIS — N63 Unspecified lump in unspecified breast: Secondary | ICD-10-CM

## 2017-05-29 DIAGNOSIS — Z0001 Encounter for general adult medical examination with abnormal findings: Secondary | ICD-10-CM | POA: Diagnosis not present

## 2017-06-01 DIAGNOSIS — E237 Disorder of pituitary gland, unspecified: Secondary | ICD-10-CM | POA: Diagnosis not present

## 2017-06-01 DIAGNOSIS — Z6821 Body mass index (BMI) 21.0-21.9, adult: Secondary | ICD-10-CM | POA: Diagnosis not present

## 2017-06-01 DIAGNOSIS — E559 Vitamin D deficiency, unspecified: Secondary | ICD-10-CM | POA: Diagnosis not present

## 2017-06-01 DIAGNOSIS — G4762 Sleep related leg cramps: Secondary | ICD-10-CM | POA: Diagnosis not present

## 2017-06-01 DIAGNOSIS — E782 Mixed hyperlipidemia: Secondary | ICD-10-CM | POA: Diagnosis not present

## 2017-06-01 DIAGNOSIS — E039 Hypothyroidism, unspecified: Secondary | ICD-10-CM | POA: Diagnosis not present

## 2017-06-01 DIAGNOSIS — Z0001 Encounter for general adult medical examination with abnormal findings: Secondary | ICD-10-CM | POA: Diagnosis not present

## 2017-06-01 DIAGNOSIS — Z86010 Personal history of colonic polyps: Secondary | ICD-10-CM | POA: Diagnosis not present

## 2017-08-02 DIAGNOSIS — E221 Hyperprolactinemia: Secondary | ICD-10-CM | POA: Diagnosis not present

## 2017-08-02 DIAGNOSIS — E038 Other specified hypothyroidism: Secondary | ICD-10-CM | POA: Diagnosis not present

## 2017-08-03 DIAGNOSIS — Z23 Encounter for immunization: Secondary | ICD-10-CM | POA: Diagnosis not present

## 2017-08-06 DIAGNOSIS — E221 Hyperprolactinemia: Secondary | ICD-10-CM | POA: Diagnosis not present

## 2017-08-06 DIAGNOSIS — M8580 Other specified disorders of bone density and structure, unspecified site: Secondary | ICD-10-CM | POA: Diagnosis not present

## 2017-08-06 DIAGNOSIS — Z1389 Encounter for screening for other disorder: Secondary | ICD-10-CM | POA: Diagnosis not present

## 2017-08-06 DIAGNOSIS — D352 Benign neoplasm of pituitary gland: Secondary | ICD-10-CM | POA: Diagnosis not present

## 2017-08-06 DIAGNOSIS — E7849 Other hyperlipidemia: Secondary | ICD-10-CM | POA: Diagnosis not present

## 2017-08-06 DIAGNOSIS — R740 Nonspecific elevation of levels of transaminase and lactic acid dehydrogenase [LDH]: Secondary | ICD-10-CM | POA: Diagnosis not present

## 2017-08-06 DIAGNOSIS — E038 Other specified hypothyroidism: Secondary | ICD-10-CM | POA: Diagnosis not present

## 2017-08-06 DIAGNOSIS — Z6821 Body mass index (BMI) 21.0-21.9, adult: Secondary | ICD-10-CM | POA: Diagnosis not present

## 2017-11-27 DIAGNOSIS — E559 Vitamin D deficiency, unspecified: Secondary | ICD-10-CM | POA: Diagnosis not present

## 2017-11-27 DIAGNOSIS — E782 Mixed hyperlipidemia: Secondary | ICD-10-CM | POA: Diagnosis not present

## 2017-11-27 DIAGNOSIS — E039 Hypothyroidism, unspecified: Secondary | ICD-10-CM | POA: Diagnosis not present

## 2017-11-29 DIAGNOSIS — Z6820 Body mass index (BMI) 20.0-20.9, adult: Secondary | ICD-10-CM | POA: Diagnosis not present

## 2017-11-29 DIAGNOSIS — Z86010 Personal history of colonic polyps: Secondary | ICD-10-CM | POA: Diagnosis not present

## 2017-11-29 DIAGNOSIS — E782 Mixed hyperlipidemia: Secondary | ICD-10-CM | POA: Diagnosis not present

## 2017-11-29 DIAGNOSIS — E039 Hypothyroidism, unspecified: Secondary | ICD-10-CM | POA: Diagnosis not present

## 2017-11-29 DIAGNOSIS — R945 Abnormal results of liver function studies: Secondary | ICD-10-CM | POA: Diagnosis not present

## 2017-11-29 DIAGNOSIS — G4762 Sleep related leg cramps: Secondary | ICD-10-CM | POA: Diagnosis not present

## 2017-11-29 DIAGNOSIS — E237 Disorder of pituitary gland, unspecified: Secondary | ICD-10-CM | POA: Diagnosis not present

## 2018-01-28 DIAGNOSIS — Z01419 Encounter for gynecological examination (general) (routine) without abnormal findings: Secondary | ICD-10-CM | POA: Diagnosis not present

## 2018-01-28 DIAGNOSIS — M8588 Other specified disorders of bone density and structure, other site: Secondary | ICD-10-CM | POA: Diagnosis not present

## 2018-01-28 DIAGNOSIS — Z6821 Body mass index (BMI) 21.0-21.9, adult: Secondary | ICD-10-CM | POA: Diagnosis not present

## 2018-01-28 DIAGNOSIS — N958 Other specified menopausal and perimenopausal disorders: Secondary | ICD-10-CM | POA: Diagnosis not present

## 2018-02-26 ENCOUNTER — Other Ambulatory Visit: Payer: Self-pay | Admitting: Obstetrics and Gynecology

## 2018-02-26 DIAGNOSIS — N6320 Unspecified lump in the left breast, unspecified quadrant: Secondary | ICD-10-CM

## 2018-03-14 ENCOUNTER — Ambulatory Visit
Admission: RE | Admit: 2018-03-14 | Discharge: 2018-03-14 | Disposition: A | Discharge status: Home / Self Care | Payer: BLUE CROSS/BLUE SHIELD | Source: Ambulatory Visit | Attending: Obstetrics and Gynecology | Admitting: Obstetrics and Gynecology

## 2018-03-14 ENCOUNTER — Ambulatory Visit
Admission: RE | Admit: 2018-03-14 | Discharge: 2018-03-14 | Disposition: A | Discharge status: Home / Self Care | Payer: PPO | Source: Ambulatory Visit | Attending: Obstetrics and Gynecology | Admitting: Obstetrics and Gynecology

## 2018-03-14 DIAGNOSIS — N6320 Unspecified lump in the left breast, unspecified quadrant: Secondary | ICD-10-CM | POA: Diagnosis not present

## 2018-03-14 DIAGNOSIS — R922 Inconclusive mammogram: Secondary | ICD-10-CM | POA: Diagnosis not present

## 2018-06-03 DIAGNOSIS — Z0001 Encounter for general adult medical examination with abnormal findings: Secondary | ICD-10-CM | POA: Diagnosis not present

## 2018-06-03 DIAGNOSIS — Z6821 Body mass index (BMI) 21.0-21.9, adult: Secondary | ICD-10-CM | POA: Diagnosis not present

## 2018-06-03 DIAGNOSIS — E039 Hypothyroidism, unspecified: Secondary | ICD-10-CM | POA: Diagnosis not present

## 2018-06-03 DIAGNOSIS — E782 Mixed hyperlipidemia: Secondary | ICD-10-CM | POA: Diagnosis not present

## 2018-06-03 DIAGNOSIS — E559 Vitamin D deficiency, unspecified: Secondary | ICD-10-CM | POA: Diagnosis not present

## 2018-06-03 DIAGNOSIS — R945 Abnormal results of liver function studies: Secondary | ICD-10-CM | POA: Diagnosis not present

## 2018-06-05 DIAGNOSIS — E039 Hypothyroidism, unspecified: Secondary | ICD-10-CM | POA: Diagnosis not present

## 2018-06-05 DIAGNOSIS — Z86010 Personal history of colonic polyps: Secondary | ICD-10-CM | POA: Diagnosis not present

## 2018-06-05 DIAGNOSIS — Z0001 Encounter for general adult medical examination with abnormal findings: Secondary | ICD-10-CM | POA: Diagnosis not present

## 2018-06-05 DIAGNOSIS — Z23 Encounter for immunization: Secondary | ICD-10-CM | POA: Diagnosis not present

## 2018-06-05 DIAGNOSIS — E782 Mixed hyperlipidemia: Secondary | ICD-10-CM | POA: Diagnosis not present

## 2018-06-05 DIAGNOSIS — Z6821 Body mass index (BMI) 21.0-21.9, adult: Secondary | ICD-10-CM | POA: Diagnosis not present

## 2018-06-05 DIAGNOSIS — E559 Vitamin D deficiency, unspecified: Secondary | ICD-10-CM | POA: Diagnosis not present

## 2018-06-05 DIAGNOSIS — E237 Disorder of pituitary gland, unspecified: Secondary | ICD-10-CM | POA: Diagnosis not present

## 2018-06-27 DIAGNOSIS — R490 Dysphonia: Secondary | ICD-10-CM | POA: Diagnosis not present

## 2018-06-27 DIAGNOSIS — I10 Essential (primary) hypertension: Secondary | ICD-10-CM | POA: Diagnosis not present

## 2018-07-02 NOTE — Congregational Nurse Program (Unsigned)
  Dept: [phone]   Congregational Nurse Program Note  Date of Encounter: 06/26/2018  Past Medical History: Past Medical History:  Diagnosis Date  . GERD (gastroesophageal reflux disease)    occasionally,uses tums  . Hyperlipidemia   . Hypothyroidism     Encounter Details: CNP Questionnaire - 06/26/18 1130      Questionnaire   Patient Status  Not Applicable    Race  White or Caucasian    Location Patient [place], [employer]    Uninsured  Not Applicable    Food  No food insecurities    Housing/Utilities  Yes, have permanent housing    Transportation  No transportation needs;Yes, need transportation assistance    Interpersonal Safety  Yes, feel physically and emotionally safe where you currently live    Medication  No medication insecurities    Medical Provider  Yes    Referrals  Not Applicable    ED Visit Averted  Not Applicable    Life-Saving Intervention Made  Not Applicable      Client was seen for bp readings, questioned her personal machine was accurate.  Compared reading between personal bp readings and nurse's bp readings.  Client has a PCP monitoring BP readings. BP readings: 190/99  174/82  161/76  162/84.  Client to follow up with PCP regarding BPS reading.  Mariam Dollar, RN  [phone].

## 2018-08-08 DIAGNOSIS — D352 Benign neoplasm of pituitary gland: Secondary | ICD-10-CM | POA: Diagnosis not present

## 2018-08-08 DIAGNOSIS — M859 Disorder of bone density and structure, unspecified: Secondary | ICD-10-CM | POA: Diagnosis not present

## 2018-08-08 DIAGNOSIS — E039 Hypothyroidism, unspecified: Secondary | ICD-10-CM | POA: Diagnosis not present

## 2018-08-08 DIAGNOSIS — E038 Other specified hypothyroidism: Secondary | ICD-10-CM | POA: Diagnosis not present

## 2018-08-08 DIAGNOSIS — M8580 Other specified disorders of bone density and structure, unspecified site: Secondary | ICD-10-CM | POA: Diagnosis not present

## 2018-08-12 DIAGNOSIS — D352 Benign neoplasm of pituitary gland: Secondary | ICD-10-CM | POA: Diagnosis not present

## 2018-08-12 DIAGNOSIS — E038 Other specified hypothyroidism: Secondary | ICD-10-CM | POA: Diagnosis not present

## 2018-08-12 DIAGNOSIS — E221 Hyperprolactinemia: Secondary | ICD-10-CM | POA: Diagnosis not present

## 2018-08-12 DIAGNOSIS — E7849 Other hyperlipidemia: Secondary | ICD-10-CM | POA: Diagnosis not present

## 2018-08-12 DIAGNOSIS — Z6820 Body mass index (BMI) 20.0-20.9, adult: Secondary | ICD-10-CM | POA: Diagnosis not present

## 2018-08-12 DIAGNOSIS — R740 Nonspecific elevation of levels of transaminase and lactic acid dehydrogenase [LDH]: Secondary | ICD-10-CM | POA: Diagnosis not present

## 2018-08-12 DIAGNOSIS — M859 Disorder of bone density and structure, unspecified: Secondary | ICD-10-CM | POA: Diagnosis not present

## 2018-08-27 DIAGNOSIS — E782 Mixed hyperlipidemia: Secondary | ICD-10-CM | POA: Diagnosis not present

## 2018-08-27 DIAGNOSIS — I10 Essential (primary) hypertension: Secondary | ICD-10-CM | POA: Diagnosis not present

## 2018-08-27 DIAGNOSIS — Z6820 Body mass index (BMI) 20.0-20.9, adult: Secondary | ICD-10-CM | POA: Diagnosis not present

## 2018-08-27 DIAGNOSIS — R490 Dysphonia: Secondary | ICD-10-CM | POA: Diagnosis not present

## 2018-08-27 DIAGNOSIS — R29818 Other symptoms and signs involving the nervous system: Secondary | ICD-10-CM | POA: Diagnosis not present

## 2018-08-27 DIAGNOSIS — E039 Hypothyroidism, unspecified: Secondary | ICD-10-CM | POA: Diagnosis not present

## 2018-10-28 DIAGNOSIS — L570 Actinic keratosis: Secondary | ICD-10-CM | POA: Diagnosis not present

## 2018-10-28 DIAGNOSIS — L02229 Furuncle of trunk, unspecified: Secondary | ICD-10-CM | POA: Diagnosis not present

## 2018-10-28 DIAGNOSIS — X32XXXD Exposure to sunlight, subsequent encounter: Secondary | ICD-10-CM | POA: Diagnosis not present

## 2018-10-28 DIAGNOSIS — Z1283 Encounter for screening for malignant neoplasm of skin: Secondary | ICD-10-CM | POA: Diagnosis not present

## 2018-10-28 DIAGNOSIS — B9689 Other specified bacterial agents as the cause of diseases classified elsewhere: Secondary | ICD-10-CM | POA: Diagnosis not present

## 2018-10-28 DIAGNOSIS — D225 Melanocytic nevi of trunk: Secondary | ICD-10-CM | POA: Diagnosis not present

## 2018-11-29 DIAGNOSIS — E039 Hypothyroidism, unspecified: Secondary | ICD-10-CM | POA: Diagnosis not present

## 2018-11-29 DIAGNOSIS — E559 Vitamin D deficiency, unspecified: Secondary | ICD-10-CM | POA: Diagnosis not present

## 2018-11-29 DIAGNOSIS — R945 Abnormal results of liver function studies: Secondary | ICD-10-CM | POA: Diagnosis not present

## 2018-11-29 DIAGNOSIS — I10 Essential (primary) hypertension: Secondary | ICD-10-CM | POA: Diagnosis not present

## 2018-11-29 DIAGNOSIS — E782 Mixed hyperlipidemia: Secondary | ICD-10-CM | POA: Diagnosis not present

## 2018-12-03 DIAGNOSIS — E039 Hypothyroidism, unspecified: Secondary | ICD-10-CM | POA: Diagnosis not present

## 2018-12-03 DIAGNOSIS — E782 Mixed hyperlipidemia: Secondary | ICD-10-CM | POA: Diagnosis not present

## 2018-12-03 DIAGNOSIS — R29818 Other symptoms and signs involving the nervous system: Secondary | ICD-10-CM | POA: Diagnosis not present

## 2018-12-03 DIAGNOSIS — R490 Dysphonia: Secondary | ICD-10-CM | POA: Diagnosis not present

## 2018-12-03 DIAGNOSIS — E559 Vitamin D deficiency, unspecified: Secondary | ICD-10-CM | POA: Diagnosis not present

## 2018-12-03 DIAGNOSIS — I10 Essential (primary) hypertension: Secondary | ICD-10-CM | POA: Diagnosis not present

## 2018-12-03 DIAGNOSIS — Z6820 Body mass index (BMI) 20.0-20.9, adult: Secondary | ICD-10-CM | POA: Diagnosis not present

## 2019-03-11 ENCOUNTER — Other Ambulatory Visit: Payer: Self-pay | Admitting: Obstetrics and Gynecology

## 2019-03-11 DIAGNOSIS — Z1231 Encounter for screening mammogram for malignant neoplasm of breast: Secondary | ICD-10-CM

## 2019-03-25 DIAGNOSIS — I10 Essential (primary) hypertension: Secondary | ICD-10-CM | POA: Diagnosis not present

## 2019-03-25 DIAGNOSIS — E039 Hypothyroidism, unspecified: Secondary | ICD-10-CM | POA: Diagnosis not present

## 2019-03-31 DIAGNOSIS — Z01419 Encounter for gynecological examination (general) (routine) without abnormal findings: Secondary | ICD-10-CM | POA: Diagnosis not present

## 2019-03-31 DIAGNOSIS — Z6820 Body mass index (BMI) 20.0-20.9, adult: Secondary | ICD-10-CM | POA: Diagnosis not present

## 2019-04-21 DIAGNOSIS — L438 Other lichen planus: Secondary | ICD-10-CM | POA: Diagnosis not present

## 2019-04-25 DIAGNOSIS — I10 Essential (primary) hypertension: Secondary | ICD-10-CM | POA: Diagnosis not present

## 2019-04-25 DIAGNOSIS — E7800 Pure hypercholesterolemia, unspecified: Secondary | ICD-10-CM | POA: Diagnosis not present

## 2019-05-01 ENCOUNTER — Other Ambulatory Visit: Payer: Self-pay

## 2019-05-01 ENCOUNTER — Ambulatory Visit
Admission: RE | Admit: 2019-05-01 | Discharge: 2019-05-01 | Disposition: A | Discharge status: Home / Self Care | Payer: PPO | Source: Ambulatory Visit | Attending: Obstetrics and Gynecology | Admitting: Obstetrics and Gynecology

## 2019-05-01 DIAGNOSIS — Z1231 Encounter for screening mammogram for malignant neoplasm of breast: Secondary | ICD-10-CM | POA: Diagnosis not present

## 2019-05-29 DIAGNOSIS — I10 Essential (primary) hypertension: Secondary | ICD-10-CM | POA: Diagnosis not present

## 2019-05-29 DIAGNOSIS — R945 Abnormal results of liver function studies: Secondary | ICD-10-CM | POA: Diagnosis not present

## 2019-05-29 DIAGNOSIS — E782 Mixed hyperlipidemia: Secondary | ICD-10-CM | POA: Diagnosis not present

## 2019-05-29 DIAGNOSIS — E559 Vitamin D deficiency, unspecified: Secondary | ICD-10-CM | POA: Diagnosis not present

## 2019-05-29 DIAGNOSIS — E039 Hypothyroidism, unspecified: Secondary | ICD-10-CM | POA: Diagnosis not present

## 2019-06-05 DIAGNOSIS — E039 Hypothyroidism, unspecified: Secondary | ICD-10-CM | POA: Diagnosis not present

## 2019-06-05 DIAGNOSIS — Z0001 Encounter for general adult medical examination with abnormal findings: Secondary | ICD-10-CM | POA: Diagnosis not present

## 2019-06-05 DIAGNOSIS — E782 Mixed hyperlipidemia: Secondary | ICD-10-CM | POA: Diagnosis not present

## 2019-06-05 DIAGNOSIS — Z6820 Body mass index (BMI) 20.0-20.9, adult: Secondary | ICD-10-CM | POA: Diagnosis not present

## 2019-06-05 DIAGNOSIS — I10 Essential (primary) hypertension: Secondary | ICD-10-CM | POA: Diagnosis not present

## 2019-06-05 DIAGNOSIS — E559 Vitamin D deficiency, unspecified: Secondary | ICD-10-CM | POA: Diagnosis not present

## 2019-06-05 DIAGNOSIS — Z23 Encounter for immunization: Secondary | ICD-10-CM | POA: Diagnosis not present

## 2019-08-07 DIAGNOSIS — Z7689 Persons encountering health services in other specified circumstances: Secondary | ICD-10-CM | POA: Diagnosis not present

## 2019-08-07 DIAGNOSIS — M859 Disorder of bone density and structure, unspecified: Secondary | ICD-10-CM | POA: Diagnosis not present

## 2019-08-07 DIAGNOSIS — E038 Other specified hypothyroidism: Secondary | ICD-10-CM | POA: Diagnosis not present

## 2019-08-08 DIAGNOSIS — Z6820 Body mass index (BMI) 20.0-20.9, adult: Secondary | ICD-10-CM | POA: Diagnosis not present

## 2019-08-08 DIAGNOSIS — R609 Edema, unspecified: Secondary | ICD-10-CM | POA: Diagnosis not present

## 2019-08-11 DIAGNOSIS — E785 Hyperlipidemia, unspecified: Secondary | ICD-10-CM | POA: Diagnosis not present

## 2019-08-11 DIAGNOSIS — E039 Hypothyroidism, unspecified: Secondary | ICD-10-CM | POA: Diagnosis not present

## 2019-08-11 DIAGNOSIS — D352 Benign neoplasm of pituitary gland: Secondary | ICD-10-CM | POA: Diagnosis not present

## 2019-08-11 DIAGNOSIS — R7401 Elevation of levels of liver transaminase levels: Secondary | ICD-10-CM | POA: Diagnosis not present

## 2019-08-11 DIAGNOSIS — Z1331 Encounter for screening for depression: Secondary | ICD-10-CM | POA: Diagnosis not present

## 2019-08-11 DIAGNOSIS — E221 Hyperprolactinemia: Secondary | ICD-10-CM | POA: Diagnosis not present

## 2019-08-11 DIAGNOSIS — I10 Essential (primary) hypertension: Secondary | ICD-10-CM | POA: Diagnosis not present

## 2019-11-12 DIAGNOSIS — Z20828 Contact with and (suspected) exposure to other viral communicable diseases: Secondary | ICD-10-CM | POA: Diagnosis not present

## 2019-11-27 DIAGNOSIS — E782 Mixed hyperlipidemia: Secondary | ICD-10-CM | POA: Diagnosis not present

## 2019-11-27 DIAGNOSIS — I10 Essential (primary) hypertension: Secondary | ICD-10-CM | POA: Diagnosis not present

## 2019-11-27 DIAGNOSIS — R945 Abnormal results of liver function studies: Secondary | ICD-10-CM | POA: Diagnosis not present

## 2019-11-27 DIAGNOSIS — E039 Hypothyroidism, unspecified: Secondary | ICD-10-CM | POA: Diagnosis not present

## 2019-12-04 DIAGNOSIS — R609 Edema, unspecified: Secondary | ICD-10-CM | POA: Diagnosis not present

## 2019-12-04 DIAGNOSIS — E782 Mixed hyperlipidemia: Secondary | ICD-10-CM | POA: Diagnosis not present

## 2019-12-04 DIAGNOSIS — Z6820 Body mass index (BMI) 20.0-20.9, adult: Secondary | ICD-10-CM | POA: Diagnosis not present

## 2019-12-04 DIAGNOSIS — R202 Paresthesia of skin: Secondary | ICD-10-CM | POA: Diagnosis not present

## 2019-12-04 DIAGNOSIS — Q667 Congenital pes cavus, unspecified foot: Secondary | ICD-10-CM | POA: Diagnosis not present

## 2019-12-04 DIAGNOSIS — E039 Hypothyroidism, unspecified: Secondary | ICD-10-CM | POA: Diagnosis not present

## 2019-12-04 DIAGNOSIS — R7309 Other abnormal glucose: Secondary | ICD-10-CM | POA: Diagnosis not present

## 2019-12-04 DIAGNOSIS — Z0001 Encounter for general adult medical examination with abnormal findings: Secondary | ICD-10-CM | POA: Diagnosis not present

## 2019-12-04 DIAGNOSIS — I10 Essential (primary) hypertension: Secondary | ICD-10-CM | POA: Diagnosis not present

## 2020-04-22 DIAGNOSIS — N958 Other specified menopausal and perimenopausal disorders: Secondary | ICD-10-CM | POA: Diagnosis not present

## 2020-04-22 DIAGNOSIS — Z01419 Encounter for gynecological examination (general) (routine) without abnormal findings: Secondary | ICD-10-CM | POA: Diagnosis not present

## 2020-04-22 DIAGNOSIS — M8588 Other specified disorders of bone density and structure, other site: Secondary | ICD-10-CM | POA: Diagnosis not present

## 2020-04-22 DIAGNOSIS — Z6820 Body mass index (BMI) 20.0-20.9, adult: Secondary | ICD-10-CM | POA: Diagnosis not present

## 2020-06-03 DIAGNOSIS — E782 Mixed hyperlipidemia: Secondary | ICD-10-CM | POA: Diagnosis not present

## 2020-06-03 DIAGNOSIS — R945 Abnormal results of liver function studies: Secondary | ICD-10-CM | POA: Diagnosis not present

## 2020-06-03 DIAGNOSIS — R7309 Other abnormal glucose: Secondary | ICD-10-CM | POA: Diagnosis not present

## 2020-06-03 DIAGNOSIS — I10 Essential (primary) hypertension: Secondary | ICD-10-CM | POA: Diagnosis not present

## 2020-06-03 DIAGNOSIS — E039 Hypothyroidism, unspecified: Secondary | ICD-10-CM | POA: Diagnosis not present

## 2020-06-10 DIAGNOSIS — Z23 Encounter for immunization: Secondary | ICD-10-CM | POA: Diagnosis not present

## 2020-06-10 DIAGNOSIS — Z0001 Encounter for general adult medical examination with abnormal findings: Secondary | ICD-10-CM | POA: Diagnosis not present

## 2020-06-10 DIAGNOSIS — Z6820 Body mass index (BMI) 20.0-20.9, adult: Secondary | ICD-10-CM | POA: Diagnosis not present

## 2020-06-10 DIAGNOSIS — I10 Essential (primary) hypertension: Secondary | ICD-10-CM | POA: Diagnosis not present

## 2020-06-10 DIAGNOSIS — R7303 Prediabetes: Secondary | ICD-10-CM | POA: Diagnosis not present

## 2020-06-10 DIAGNOSIS — E782 Mixed hyperlipidemia: Secondary | ICD-10-CM | POA: Diagnosis not present

## 2020-06-10 DIAGNOSIS — E039 Hypothyroidism, unspecified: Secondary | ICD-10-CM | POA: Diagnosis not present

## 2020-06-29 ENCOUNTER — Other Ambulatory Visit: Payer: Self-pay | Admitting: Obstetrics and Gynecology

## 2020-06-29 DIAGNOSIS — Z1231 Encounter for screening mammogram for malignant neoplasm of breast: Secondary | ICD-10-CM

## 2020-07-02 DIAGNOSIS — H2513 Age-related nuclear cataract, bilateral: Secondary | ICD-10-CM | POA: Diagnosis not present

## 2020-07-02 DIAGNOSIS — H35033 Hypertensive retinopathy, bilateral: Secondary | ICD-10-CM | POA: Diagnosis not present

## 2020-07-02 DIAGNOSIS — I10 Essential (primary) hypertension: Secondary | ICD-10-CM | POA: Diagnosis not present

## 2020-07-02 DIAGNOSIS — H524 Presbyopia: Secondary | ICD-10-CM | POA: Diagnosis not present

## 2020-07-27 ENCOUNTER — Other Ambulatory Visit: Payer: Self-pay

## 2020-07-27 ENCOUNTER — Ambulatory Visit
Admission: RE | Admit: 2020-07-27 | Discharge: 2020-07-27 | Disposition: A | Discharge status: Home / Self Care | Payer: PPO | Source: Ambulatory Visit | Attending: Obstetrics and Gynecology | Admitting: Obstetrics and Gynecology

## 2020-07-27 DIAGNOSIS — Z1231 Encounter for screening mammogram for malignant neoplasm of breast: Secondary | ICD-10-CM | POA: Diagnosis not present

## 2020-08-11 DIAGNOSIS — E221 Hyperprolactinemia: Secondary | ICD-10-CM | POA: Diagnosis not present

## 2020-08-11 DIAGNOSIS — D352 Benign neoplasm of pituitary gland: Secondary | ICD-10-CM | POA: Diagnosis not present

## 2020-08-11 DIAGNOSIS — E039 Hypothyroidism, unspecified: Secondary | ICD-10-CM | POA: Diagnosis not present

## 2020-08-11 DIAGNOSIS — I10 Essential (primary) hypertension: Secondary | ICD-10-CM | POA: Diagnosis not present

## 2020-08-11 DIAGNOSIS — M8580 Other specified disorders of bone density and structure, unspecified site: Secondary | ICD-10-CM | POA: Diagnosis not present

## 2020-08-11 DIAGNOSIS — E785 Hyperlipidemia, unspecified: Secondary | ICD-10-CM | POA: Diagnosis not present

## 2020-08-26 DIAGNOSIS — M859 Disorder of bone density and structure, unspecified: Secondary | ICD-10-CM | POA: Diagnosis not present

## 2020-09-03 DIAGNOSIS — Z20828 Contact with and (suspected) exposure to other viral communicable diseases: Secondary | ICD-10-CM | POA: Diagnosis not present

## 2020-11-11 DIAGNOSIS — D225 Melanocytic nevi of trunk: Secondary | ICD-10-CM | POA: Diagnosis not present

## 2020-11-11 DIAGNOSIS — B078 Other viral warts: Secondary | ICD-10-CM | POA: Diagnosis not present

## 2020-11-11 DIAGNOSIS — D2272 Melanocytic nevi of left lower limb, including hip: Secondary | ICD-10-CM | POA: Diagnosis not present

## 2020-11-11 DIAGNOSIS — L438 Other lichen planus: Secondary | ICD-10-CM | POA: Diagnosis not present

## 2020-11-11 DIAGNOSIS — Z1283 Encounter for screening for malignant neoplasm of skin: Secondary | ICD-10-CM | POA: Diagnosis not present

## 2020-11-11 DIAGNOSIS — D485 Neoplasm of uncertain behavior of skin: Secondary | ICD-10-CM | POA: Diagnosis not present

## 2020-11-11 DIAGNOSIS — L820 Inflamed seborrheic keratosis: Secondary | ICD-10-CM | POA: Diagnosis not present

## 2020-12-07 DIAGNOSIS — E7849 Other hyperlipidemia: Secondary | ICD-10-CM | POA: Diagnosis not present

## 2020-12-07 DIAGNOSIS — E782 Mixed hyperlipidemia: Secondary | ICD-10-CM | POA: Diagnosis not present

## 2020-12-07 DIAGNOSIS — R7309 Other abnormal glucose: Secondary | ICD-10-CM | POA: Diagnosis not present

## 2020-12-07 DIAGNOSIS — R7989 Other specified abnormal findings of blood chemistry: Secondary | ICD-10-CM | POA: Diagnosis not present

## 2020-12-10 DIAGNOSIS — I10 Essential (primary) hypertension: Secondary | ICD-10-CM | POA: Diagnosis not present

## 2020-12-10 DIAGNOSIS — M62838 Other muscle spasm: Secondary | ICD-10-CM | POA: Diagnosis not present

## 2020-12-10 DIAGNOSIS — E039 Hypothyroidism, unspecified: Secondary | ICD-10-CM | POA: Diagnosis not present

## 2020-12-10 DIAGNOSIS — Z681 Body mass index (BMI) 19 or less, adult: Secondary | ICD-10-CM | POA: Diagnosis not present

## 2020-12-10 DIAGNOSIS — E7849 Other hyperlipidemia: Secondary | ICD-10-CM | POA: Diagnosis not present

## 2020-12-10 DIAGNOSIS — R7303 Prediabetes: Secondary | ICD-10-CM | POA: Diagnosis not present

## 2020-12-10 DIAGNOSIS — R609 Edema, unspecified: Secondary | ICD-10-CM | POA: Diagnosis not present

## 2021-02-24 DIAGNOSIS — D485 Neoplasm of uncertain behavior of skin: Secondary | ICD-10-CM | POA: Diagnosis not present

## 2021-02-24 DIAGNOSIS — L821 Other seborrheic keratosis: Secondary | ICD-10-CM | POA: Diagnosis not present

## 2021-02-24 DIAGNOSIS — L438 Other lichen planus: Secondary | ICD-10-CM | POA: Diagnosis not present

## 2021-04-25 DIAGNOSIS — Z20828 Contact with and (suspected) exposure to other viral communicable diseases: Secondary | ICD-10-CM | POA: Diagnosis not present

## 2021-05-09 DIAGNOSIS — L438 Other lichen planus: Secondary | ICD-10-CM | POA: Diagnosis not present

## 2021-05-09 DIAGNOSIS — L308 Other specified dermatitis: Secondary | ICD-10-CM | POA: Diagnosis not present

## 2021-05-16 DIAGNOSIS — Z124 Encounter for screening for malignant neoplasm of cervix: Secondary | ICD-10-CM | POA: Diagnosis not present

## 2021-05-16 DIAGNOSIS — Z681 Body mass index (BMI) 19 or less, adult: Secondary | ICD-10-CM | POA: Diagnosis not present

## 2021-06-10 DIAGNOSIS — R7989 Other specified abnormal findings of blood chemistry: Secondary | ICD-10-CM | POA: Diagnosis not present

## 2021-06-10 DIAGNOSIS — E7849 Other hyperlipidemia: Secondary | ICD-10-CM | POA: Diagnosis not present

## 2021-06-10 DIAGNOSIS — R7309 Other abnormal glucose: Secondary | ICD-10-CM | POA: Diagnosis not present

## 2021-06-10 DIAGNOSIS — E237 Disorder of pituitary gland, unspecified: Secondary | ICD-10-CM | POA: Diagnosis not present

## 2021-06-10 DIAGNOSIS — R7303 Prediabetes: Secondary | ICD-10-CM | POA: Diagnosis not present

## 2021-06-10 DIAGNOSIS — E039 Hypothyroidism, unspecified: Secondary | ICD-10-CM | POA: Diagnosis not present

## 2021-06-10 DIAGNOSIS — E782 Mixed hyperlipidemia: Secondary | ICD-10-CM | POA: Diagnosis not present

## 2021-06-10 DIAGNOSIS — I10 Essential (primary) hypertension: Secondary | ICD-10-CM | POA: Diagnosis not present

## 2021-06-14 DIAGNOSIS — R7303 Prediabetes: Secondary | ICD-10-CM | POA: Diagnosis not present

## 2021-06-14 DIAGNOSIS — Z0001 Encounter for general adult medical examination with abnormal findings: Secondary | ICD-10-CM | POA: Diagnosis not present

## 2021-06-14 DIAGNOSIS — E039 Hypothyroidism, unspecified: Secondary | ICD-10-CM | POA: Diagnosis not present

## 2021-06-14 DIAGNOSIS — E7849 Other hyperlipidemia: Secondary | ICD-10-CM | POA: Diagnosis not present

## 2021-06-14 DIAGNOSIS — Z23 Encounter for immunization: Secondary | ICD-10-CM | POA: Diagnosis not present

## 2021-06-14 DIAGNOSIS — I10 Essential (primary) hypertension: Secondary | ICD-10-CM | POA: Diagnosis not present

## 2021-06-14 DIAGNOSIS — Z681 Body mass index (BMI) 19 or less, adult: Secondary | ICD-10-CM | POA: Diagnosis not present

## 2021-07-07 DIAGNOSIS — L820 Inflamed seborrheic keratosis: Secondary | ICD-10-CM | POA: Diagnosis not present

## 2021-07-07 DIAGNOSIS — L0100 Impetigo, unspecified: Secondary | ICD-10-CM | POA: Diagnosis not present

## 2021-08-05 DIAGNOSIS — E221 Hyperprolactinemia: Secondary | ICD-10-CM | POA: Diagnosis not present

## 2021-08-05 DIAGNOSIS — M859 Disorder of bone density and structure, unspecified: Secondary | ICD-10-CM | POA: Diagnosis not present

## 2021-08-12 DIAGNOSIS — E039 Hypothyroidism, unspecified: Secondary | ICD-10-CM | POA: Diagnosis not present

## 2021-08-12 DIAGNOSIS — R7989 Other specified abnormal findings of blood chemistry: Secondary | ICD-10-CM | POA: Diagnosis not present

## 2021-08-12 DIAGNOSIS — I10 Essential (primary) hypertension: Secondary | ICD-10-CM | POA: Diagnosis not present

## 2021-08-12 DIAGNOSIS — M8580 Other specified disorders of bone density and structure, unspecified site: Secondary | ICD-10-CM | POA: Diagnosis not present

## 2021-08-12 DIAGNOSIS — E785 Hyperlipidemia, unspecified: Secondary | ICD-10-CM | POA: Diagnosis not present

## 2021-08-12 DIAGNOSIS — D352 Benign neoplasm of pituitary gland: Secondary | ICD-10-CM | POA: Diagnosis not present

## 2021-08-12 DIAGNOSIS — E221 Hyperprolactinemia: Secondary | ICD-10-CM | POA: Diagnosis not present

## 2021-08-29 ENCOUNTER — Other Ambulatory Visit: Payer: Self-pay | Admitting: Obstetrics and Gynecology

## 2021-08-29 DIAGNOSIS — Z1231 Encounter for screening mammogram for malignant neoplasm of breast: Secondary | ICD-10-CM

## 2021-10-03 ENCOUNTER — Ambulatory Visit
Admission: RE | Admit: 2021-10-03 | Discharge: 2021-10-03 | Disposition: A | Discharge status: Home / Self Care | Payer: PPO | Source: Ambulatory Visit | Attending: Obstetrics and Gynecology | Admitting: Obstetrics and Gynecology

## 2021-10-03 DIAGNOSIS — Z1231 Encounter for screening mammogram for malignant neoplasm of breast: Secondary | ICD-10-CM | POA: Diagnosis not present

## 2021-10-05 ENCOUNTER — Other Ambulatory Visit: Payer: Self-pay | Admitting: Obstetrics and Gynecology

## 2021-10-05 DIAGNOSIS — R928 Other abnormal and inconclusive findings on diagnostic imaging of breast: Secondary | ICD-10-CM

## 2021-10-29 ENCOUNTER — Ambulatory Visit
Admission: RE | Admit: 2021-10-29 | Discharge: 2021-10-29 | Disposition: A | Discharge status: Home / Self Care | Payer: PPO | Source: Ambulatory Visit | Attending: Obstetrics and Gynecology | Admitting: Obstetrics and Gynecology

## 2021-10-29 ENCOUNTER — Ambulatory Visit: Payer: PPO

## 2021-10-29 DIAGNOSIS — R928 Other abnormal and inconclusive findings on diagnostic imaging of breast: Secondary | ICD-10-CM

## 2021-10-29 DIAGNOSIS — R922 Inconclusive mammogram: Secondary | ICD-10-CM | POA: Diagnosis not present

## 2021-11-03 DIAGNOSIS — Z1283 Encounter for screening for malignant neoplasm of skin: Secondary | ICD-10-CM | POA: Diagnosis not present

## 2021-11-03 DIAGNOSIS — B078 Other viral warts: Secondary | ICD-10-CM | POA: Diagnosis not present

## 2021-11-03 DIAGNOSIS — D225 Melanocytic nevi of trunk: Secondary | ICD-10-CM | POA: Diagnosis not present

## 2021-11-03 DIAGNOSIS — X32XXXD Exposure to sunlight, subsequent encounter: Secondary | ICD-10-CM | POA: Diagnosis not present

## 2021-11-03 DIAGNOSIS — L570 Actinic keratosis: Secondary | ICD-10-CM | POA: Diagnosis not present

## 2021-11-08 ENCOUNTER — Other Ambulatory Visit: Payer: PPO

## 2021-11-28 DIAGNOSIS — I10 Essential (primary) hypertension: Secondary | ICD-10-CM | POA: Diagnosis not present

## 2021-11-28 DIAGNOSIS — Z131 Encounter for screening for diabetes mellitus: Secondary | ICD-10-CM | POA: Diagnosis not present

## 2021-11-28 DIAGNOSIS — E7849 Other hyperlipidemia: Secondary | ICD-10-CM | POA: Diagnosis not present

## 2021-11-28 DIAGNOSIS — E782 Mixed hyperlipidemia: Secondary | ICD-10-CM | POA: Diagnosis not present

## 2021-12-05 DIAGNOSIS — E039 Hypothyroidism, unspecified: Secondary | ICD-10-CM | POA: Diagnosis not present

## 2021-12-05 DIAGNOSIS — R7303 Prediabetes: Secondary | ICD-10-CM | POA: Diagnosis not present

## 2021-12-05 DIAGNOSIS — Z1331 Encounter for screening for depression: Secondary | ICD-10-CM | POA: Diagnosis not present

## 2021-12-05 DIAGNOSIS — Z681 Body mass index (BMI) 19 or less, adult: Secondary | ICD-10-CM | POA: Diagnosis not present

## 2021-12-05 DIAGNOSIS — E7849 Other hyperlipidemia: Secondary | ICD-10-CM | POA: Diagnosis not present

## 2021-12-05 DIAGNOSIS — Z1389 Encounter for screening for other disorder: Secondary | ICD-10-CM | POA: Diagnosis not present

## 2021-12-05 DIAGNOSIS — I10 Essential (primary) hypertension: Secondary | ICD-10-CM | POA: Diagnosis not present

## 2021-12-05 DIAGNOSIS — K118 Other diseases of salivary glands: Secondary | ICD-10-CM | POA: Diagnosis not present

## 2021-12-06 DIAGNOSIS — F17210 Nicotine dependence, cigarettes, uncomplicated: Secondary | ICD-10-CM | POA: Diagnosis not present

## 2021-12-06 DIAGNOSIS — Z0001 Encounter for general adult medical examination with abnormal findings: Secondary | ICD-10-CM | POA: Diagnosis not present

## 2021-12-06 DIAGNOSIS — E039 Hypothyroidism, unspecified: Secondary | ICD-10-CM | POA: Diagnosis not present

## 2021-12-06 DIAGNOSIS — E7849 Other hyperlipidemia: Secondary | ICD-10-CM | POA: Diagnosis not present

## 2021-12-06 DIAGNOSIS — I10 Essential (primary) hypertension: Secondary | ICD-10-CM | POA: Diagnosis not present

## 2021-12-26 DIAGNOSIS — G451 Carotid artery syndrome (hemispheric): Secondary | ICD-10-CM | POA: Diagnosis not present

## 2021-12-26 DIAGNOSIS — K118 Other diseases of salivary glands: Secondary | ICD-10-CM | POA: Diagnosis not present

## 2022-01-20 DIAGNOSIS — H35033 Hypertensive retinopathy, bilateral: Secondary | ICD-10-CM | POA: Diagnosis not present

## 2022-01-20 DIAGNOSIS — H524 Presbyopia: Secondary | ICD-10-CM | POA: Diagnosis not present

## 2022-03-21 ENCOUNTER — Other Ambulatory Visit: Payer: Self-pay | Admitting: Otolaryngology

## 2022-03-21 ENCOUNTER — Other Ambulatory Visit (HOSPITAL_COMMUNITY): Payer: Self-pay | Admitting: Otolaryngology

## 2022-03-21 ENCOUNTER — Encounter: Payer: Self-pay | Admitting: *Deleted

## 2022-03-21 DIAGNOSIS — K118 Other diseases of salivary glands: Secondary | ICD-10-CM

## 2022-04-10 ENCOUNTER — Other Ambulatory Visit (HOSPITAL_COMMUNITY): Payer: Self-pay | Admitting: Physician Assistant

## 2022-04-11 ENCOUNTER — Ambulatory Visit (HOSPITAL_COMMUNITY)
Admission: RE | Admit: 2022-04-11 | Discharge: 2022-04-11 | Disposition: A | Discharge status: Home / Self Care | Payer: PPO | Source: Ambulatory Visit | Attending: Otolaryngology | Admitting: Otolaryngology

## 2022-04-11 DIAGNOSIS — E039 Hypothyroidism, unspecified: Secondary | ICD-10-CM | POA: Insufficient documentation

## 2022-04-11 DIAGNOSIS — E785 Hyperlipidemia, unspecified: Secondary | ICD-10-CM | POA: Insufficient documentation

## 2022-04-11 DIAGNOSIS — D110 Benign neoplasm of parotid gland: Secondary | ICD-10-CM | POA: Diagnosis not present

## 2022-04-11 DIAGNOSIS — K118 Other diseases of salivary glands: Secondary | ICD-10-CM | POA: Diagnosis not present

## 2022-04-11 DIAGNOSIS — K219 Gastro-esophageal reflux disease without esophagitis: Secondary | ICD-10-CM | POA: Insufficient documentation

## 2022-04-11 MED ORDER — LIDOCAINE HCL (PF) 1 % IJ SOLN
INTRAMUSCULAR | Status: AC
  Filled 2022-04-11: qty 30

## 2022-04-11 NOTE — H&P (Addendum)
Chief Complaint: Patient was seen in consultation today for right parotid mass  Referring Physician(s): Jerrell Belfast  Supervising Physician: Michaelle Birks  Patient Status: [facility] - Out-pt  History of Present Illness: [name] is a 71 y.o. female with PMH of GERD, hyperlipidemia, and hypothyroidism being seen today for a right parotid mass. She previously underwent a right superficial parotidectomy in 2016, following which the patient states she has had a mass for several years that has been stable in size. According to the patient, she recently asked her PCP how to know if it is growing in size, at which point her PCP ordered a biopsy. Patient was referred to IR for an image-guided biopsy of right parotid mass.  Past Medical History:  Diagnosis Date   GERD (gastroesophageal reflux disease)    occasionally,uses tums   Hyperlipidemia    Hypothyroidism     Past Surgical History:  Procedure Laterality Date   BREAST BIOPSY     HEMATOMA EVACUATION Right 10/23/2014   Procedure: EVACUATION HEMATOMA LEFT NECK;  Surgeon: Jerrell Belfast, MD;  Location: [place];  Service: ENT;  Laterality: Right;   PAROTIDECTOMY Right 10/23/2014   Procedure: RIGHT SUPERFICIAL PAROTIDECTOMY;  Surgeon: Jerrell Belfast, MD;  Location: [facility] OR;  Service: ENT;  Laterality: Right;   TONSILLECTOMY      Allergies: Garlic  Medications: Prior to Admission medications   Medication Sig Start Date End Date Taking? Authorizing Provider  amLODipine (NORVASC) 10 MG tablet Take 10 mg by mouth daily. 01/30/22  Yes [provider]  aspirin EC 81 MG tablet Take 81 mg by mouth daily. Swallow whole.   Yes [provider]  atorvastatin (LIPITOR) 40 MG tablet Take 40 mg by mouth daily. 08/17/14  Yes [provider]  cabergoline (DOSTINEX) 0.5 MG tablet Take 0.5 mg by mouth once a week. 08/31/14  Yes [provider]  Cholecalciferol (VITAMIN D) 1000 UNITS capsule Take 1,000 Units by mouth  daily.    Yes [provider]  hydrochlorothiazide (HYDRODIURIL) 25 MG tablet Take 25 mg by mouth daily. 01/29/22  Yes [provider]  levothyroxine (SYNTHROID, LEVOTHROID) 50 MCG tablet Take 50 mcg by mouth daily before breakfast.  08/17/14  Yes [provider]  Omega-3 Fatty Acids (FISH OIL PO) Take 2,000 mg by mouth daily.   Yes [provider]  triamcinolone cream (KENALOG) 0.1 % Apply 1 Application topically 3 (three) times daily.   Yes [provider]     No family history on file.  Social History   Socioeconomic History   Marital status: Married    Spouse name: Not on file   Number of children: Not on file   Years of education: Not on file   Highest education level: Not on file  Occupational History   Not on file  Tobacco Use   Smoking status: Never   Smokeless tobacco: Not on file  Substance and Sexual Activity   Alcohol use: Yes    Alcohol/week: 4.0 standard drinks of alcohol    Types: 4 Glasses of wine per week   Drug use: Not on file   Sexual activity: Not on file  Other Topics Concern   Not on file  Social History Narrative   Not on file   Social Determinants of Health   Financial Resource Strain: Not on file  Food Insecurity: Not on file  Transportation Needs: Not on file  Physical Activity: Not on file  Stress: Not on file  Social Connections: Not  on file     Review of Systems: A 12 point ROS discussed and pertinent positives are indicated in the HPI above.  All other systems are negative.  Review of Systems  Constitutional:  Negative for chills, fatigue and fever.  Respiratory:  Negative for chest tightness and shortness of breath.   Cardiovascular:  Negative for chest pain and leg swelling.  Gastrointestinal:  Negative for diarrhea, nausea and vomiting.  Neurological:  Negative for dizziness and headaches.  Psychiatric/Behavioral:  Negative for confusion.     Vital Signs: BP (!) 156/66   Pulse 72    Temp 97.7 F (36.5 C) (Oral)   Resp 18   Ht '5\' 6"'$  (1.676 m)   Wt 120 lb (54.4 kg)   SpO2 96%   BMI 19.37 kg/m     Physical Exam Vitals reviewed.  Constitutional:      General: She is not in acute distress.    Appearance: Normal appearance. She is normal weight.  HENT:     Head: Normocephalic.     Mouth/Throat:     Mouth: Mucous membranes are moist.  Cardiovascular:     Rate and Rhythm: Normal rate and regular rhythm.     Pulses: Normal pulses.     Heart sounds: Normal heart sounds.  Pulmonary:     Effort: Pulmonary effort is normal.     Breath sounds: Normal breath sounds.  Abdominal:     General: Bowel sounds are normal.     Palpations: Abdomen is soft.  Skin:    General: Skin is warm and dry.  Neurological:     Mental Status: She is alert and oriented to person, place, and time.  Psychiatric:        Mood and Affect: Mood normal.        Behavior: Behavior normal.     Imaging: No results found.  Labs:  CBC: No results for input(s): "WBC", "HGB", "HCT", "PLT" in the last 8760 hours.  COAGS: No results for input(s): "INR", "APTT" in the last 8760 hours.  BMP: No results for input(s): "NA", "K", "CL", "CO2", "GLUCOSE", "BUN", "CALCIUM", "CREATININE", "GFRNONAA", "GFRAA" in the last 8760 hours.  Invalid input(s): "CMP"  LIVER FUNCTION TESTS: No results for input(s): "BILITOT", "AST", "ALT", "ALKPHOS", "PROT", "ALBUMIN" in the last 8760 hours.  TUMOR MARKERS: No results for input(s): "AFPTM", "CEA", "CA199", "CHROMGRNA" in the last 8760 hours.  Assessment and Plan: [name] is a 71 yo female with PMH of GERD, hyperlipidemia, and hypothyroidism being seen today for a right parotid mass. During pre-procedural discussion, the patient stated she would prefer to have local anesthetic only, and that she wished to avoid sedation if at all possible. Case has been reviewed and approved for image-guided biopsy of right parotid mass on 04/11/22.   Risks and  benefits of image-guided biopsy of right parotid mass was discussed with the patient including, but not limited to bleeding, infection, damage to adjacent structures or low yield requiring additional tests.  All of the questions were answered and there is agreement to proceed.  Consent signed and in chart.   Thank you for this interesting consult.  I greatly enjoyed meeting [name] and look forward to participating in their care.  A copy of this report was sent to the requesting provider on this date.  Electronically Signed: Lura Em, PA-C 04/11/2022, 12:02 PM   I spent a total of  15 Minutes   in face to face in clinical consultation, greater than  50% of which was counseling/coordinating care for right parotid mass.

## 2022-04-11 NOTE — Procedures (Signed)
Vascular and Interventional Radiology Procedure Note  Patient: [name] DOB: [birth year] Medical Record Number: [id number] Note Date/Time: 04/11/22 12:37 PM   Performing Physician: Michaelle Birks, MD Assistant(s): None  Diagnosis: R parotid mass  Procedure: RIGHT PAROTID MASS ASPIRATION and BIOPSY  Anesthesia: Local Anesthetic Complications: None Estimated Blood Loss: Minimal Specimens: Sent for Pathology  Findings:  Successful Ultrasound-guided aspiration and biopsy of R parotid mass. A total of 1 mL of SS aspirate and 3 core samples were obtained. Hemostasis of the tract was achieved using Manual Pressure.  Plan: Bed rest for 1 hours.  See detailed procedure note with images in PACS. The patient tolerated the procedure well without incident or complication and was returned to Recovery in stable condition.    Michaelle Birks, MD [facility] Rad[facility]   Pager. [place]

## 2022-04-12 LAB — CYTOLOGY - NON PAP

## 2022-04-13 LAB — SURGICAL PATHOLOGY

## 2022-06-16 DIAGNOSIS — E782 Mixed hyperlipidemia: Secondary | ICD-10-CM | POA: Diagnosis not present

## 2022-06-16 DIAGNOSIS — R7309 Other abnormal glucose: Secondary | ICD-10-CM | POA: Diagnosis not present

## 2022-06-16 DIAGNOSIS — I10 Essential (primary) hypertension: Secondary | ICD-10-CM | POA: Diagnosis not present

## 2022-06-16 DIAGNOSIS — E039 Hypothyroidism, unspecified: Secondary | ICD-10-CM | POA: Diagnosis not present

## 2022-06-16 DIAGNOSIS — E7849 Other hyperlipidemia: Secondary | ICD-10-CM | POA: Diagnosis not present

## 2022-06-22 DIAGNOSIS — E7849 Other hyperlipidemia: Secondary | ICD-10-CM | POA: Diagnosis not present

## 2022-06-22 DIAGNOSIS — I10 Essential (primary) hypertension: Secondary | ICD-10-CM | POA: Diagnosis not present

## 2022-06-22 DIAGNOSIS — K118 Other diseases of salivary glands: Secondary | ICD-10-CM | POA: Diagnosis not present

## 2022-06-22 DIAGNOSIS — E039 Hypothyroidism, unspecified: Secondary | ICD-10-CM | POA: Diagnosis not present

## 2022-06-22 DIAGNOSIS — R7303 Prediabetes: Secondary | ICD-10-CM | POA: Diagnosis not present

## 2022-06-22 DIAGNOSIS — Z681 Body mass index (BMI) 19 or less, adult: Secondary | ICD-10-CM | POA: Diagnosis not present

## 2022-07-03 DIAGNOSIS — M8588 Other specified disorders of bone density and structure, other site: Secondary | ICD-10-CM | POA: Diagnosis not present

## 2022-07-03 DIAGNOSIS — Z681 Body mass index (BMI) 19 or less, adult: Secondary | ICD-10-CM | POA: Diagnosis not present

## 2022-07-03 DIAGNOSIS — Z01419 Encounter for gynecological examination (general) (routine) without abnormal findings: Secondary | ICD-10-CM | POA: Diagnosis not present

## 2022-07-03 DIAGNOSIS — E039 Hypothyroidism, unspecified: Secondary | ICD-10-CM | POA: Diagnosis not present

## 2022-07-03 DIAGNOSIS — N958 Other specified menopausal and perimenopausal disorders: Secondary | ICD-10-CM | POA: Diagnosis not present

## 2022-08-07 DIAGNOSIS — L438 Other lichen planus: Secondary | ICD-10-CM | POA: Diagnosis not present

## 2022-08-07 DIAGNOSIS — L700 Acne vulgaris: Secondary | ICD-10-CM | POA: Diagnosis not present

## 2022-08-07 DIAGNOSIS — L820 Inflamed seborrheic keratosis: Secondary | ICD-10-CM | POA: Diagnosis not present

## 2022-08-10 DIAGNOSIS — M8580 Other specified disorders of bone density and structure, unspecified site: Secondary | ICD-10-CM | POA: Diagnosis not present

## 2022-08-10 DIAGNOSIS — E221 Hyperprolactinemia: Secondary | ICD-10-CM | POA: Diagnosis not present

## 2022-08-14 DIAGNOSIS — E039 Hypothyroidism, unspecified: Secondary | ICD-10-CM | POA: Diagnosis not present

## 2022-08-14 DIAGNOSIS — D352 Benign neoplasm of pituitary gland: Secondary | ICD-10-CM | POA: Diagnosis not present

## 2022-08-14 DIAGNOSIS — R7989 Other specified abnormal findings of blood chemistry: Secondary | ICD-10-CM | POA: Diagnosis not present

## 2022-08-14 DIAGNOSIS — M8580 Other specified disorders of bone density and structure, unspecified site: Secondary | ICD-10-CM | POA: Diagnosis not present

## 2022-08-14 DIAGNOSIS — E221 Hyperprolactinemia: Secondary | ICD-10-CM | POA: Diagnosis not present

## 2022-08-14 DIAGNOSIS — E785 Hyperlipidemia, unspecified: Secondary | ICD-10-CM | POA: Diagnosis not present

## 2022-08-14 DIAGNOSIS — I10 Essential (primary) hypertension: Secondary | ICD-10-CM | POA: Diagnosis not present

## 2022-08-14 DIAGNOSIS — K118 Other diseases of salivary glands: Secondary | ICD-10-CM | POA: Diagnosis not present

## 2022-10-26 ENCOUNTER — Other Ambulatory Visit: Payer: Self-pay | Admitting: Obstetrics and Gynecology

## 2022-10-26 DIAGNOSIS — Z1231 Encounter for screening mammogram for malignant neoplasm of breast: Secondary | ICD-10-CM

## 2022-10-27 ENCOUNTER — Ambulatory Visit
Admission: RE | Admit: 2022-10-27 | Discharge: 2022-10-27 | Disposition: A | Discharge status: Home / Self Care | Payer: PPO | Source: Ambulatory Visit | Attending: Obstetrics and Gynecology | Admitting: Obstetrics and Gynecology

## 2022-10-27 DIAGNOSIS — Z1231 Encounter for screening mammogram for malignant neoplasm of breast: Secondary | ICD-10-CM

## 2022-11-20 DIAGNOSIS — L570 Actinic keratosis: Secondary | ICD-10-CM | POA: Diagnosis not present

## 2022-11-20 DIAGNOSIS — D225 Melanocytic nevi of trunk: Secondary | ICD-10-CM | POA: Diagnosis not present

## 2022-11-20 DIAGNOSIS — Z1283 Encounter for screening for malignant neoplasm of skin: Secondary | ICD-10-CM | POA: Diagnosis not present

## 2022-11-20 DIAGNOSIS — X32XXXD Exposure to sunlight, subsequent encounter: Secondary | ICD-10-CM | POA: Diagnosis not present

## 2022-11-20 DIAGNOSIS — L708 Other acne: Secondary | ICD-10-CM | POA: Diagnosis not present

## 2022-11-20 DIAGNOSIS — C44311 Basal cell carcinoma of skin of nose: Secondary | ICD-10-CM | POA: Diagnosis not present

## 2022-12-08 DIAGNOSIS — R7989 Other specified abnormal findings of blood chemistry: Secondary | ICD-10-CM | POA: Diagnosis not present

## 2022-12-08 DIAGNOSIS — I10 Essential (primary) hypertension: Secondary | ICD-10-CM | POA: Diagnosis not present

## 2022-12-08 DIAGNOSIS — E559 Vitamin D deficiency, unspecified: Secondary | ICD-10-CM | POA: Diagnosis not present

## 2022-12-08 DIAGNOSIS — R7303 Prediabetes: Secondary | ICD-10-CM | POA: Diagnosis not present

## 2022-12-08 DIAGNOSIS — E7849 Other hyperlipidemia: Secondary | ICD-10-CM | POA: Diagnosis not present

## 2022-12-08 DIAGNOSIS — Z0001 Encounter for general adult medical examination with abnormal findings: Secondary | ICD-10-CM | POA: Diagnosis not present

## 2022-12-27 DIAGNOSIS — I10 Essential (primary) hypertension: Secondary | ICD-10-CM | POA: Diagnosis not present

## 2022-12-27 DIAGNOSIS — Z681 Body mass index (BMI) 19 or less, adult: Secondary | ICD-10-CM | POA: Diagnosis not present

## 2022-12-27 DIAGNOSIS — Z1389 Encounter for screening for other disorder: Secondary | ICD-10-CM | POA: Diagnosis not present

## 2022-12-27 DIAGNOSIS — E039 Hypothyroidism, unspecified: Secondary | ICD-10-CM | POA: Diagnosis not present

## 2022-12-27 DIAGNOSIS — Z1331 Encounter for screening for depression: Secondary | ICD-10-CM | POA: Diagnosis not present

## 2022-12-27 DIAGNOSIS — Z0001 Encounter for general adult medical examination with abnormal findings: Secondary | ICD-10-CM | POA: Diagnosis not present

## 2022-12-27 DIAGNOSIS — E7849 Other hyperlipidemia: Secondary | ICD-10-CM | POA: Diagnosis not present

## 2022-12-27 DIAGNOSIS — Z23 Encounter for immunization: Secondary | ICD-10-CM | POA: Diagnosis not present

## 2022-12-27 DIAGNOSIS — K118 Other diseases of salivary glands: Secondary | ICD-10-CM | POA: Diagnosis not present

## 2022-12-27 DIAGNOSIS — R7303 Prediabetes: Secondary | ICD-10-CM | POA: Diagnosis not present

## 2023-02-02 DIAGNOSIS — H35433 Paving stone degeneration of retina, bilateral: Secondary | ICD-10-CM | POA: Diagnosis not present

## 2023-02-02 DIAGNOSIS — H524 Presbyopia: Secondary | ICD-10-CM | POA: Diagnosis not present

## 2023-06-21 DIAGNOSIS — R7989 Other specified abnormal findings of blood chemistry: Secondary | ICD-10-CM | POA: Diagnosis not present

## 2023-06-21 DIAGNOSIS — Z1329 Encounter for screening for other suspected endocrine disorder: Secondary | ICD-10-CM | POA: Diagnosis not present

## 2023-06-21 DIAGNOSIS — R7303 Prediabetes: Secondary | ICD-10-CM | POA: Diagnosis not present

## 2023-06-21 DIAGNOSIS — E559 Vitamin D deficiency, unspecified: Secondary | ICD-10-CM | POA: Diagnosis not present

## 2023-06-21 DIAGNOSIS — E7849 Other hyperlipidemia: Secondary | ICD-10-CM | POA: Diagnosis not present

## 2023-06-26 DIAGNOSIS — Z681 Body mass index (BMI) 19 or less, adult: Secondary | ICD-10-CM | POA: Diagnosis not present

## 2023-06-26 DIAGNOSIS — E7849 Other hyperlipidemia: Secondary | ICD-10-CM | POA: Diagnosis not present

## 2023-06-26 DIAGNOSIS — E039 Hypothyroidism, unspecified: Secondary | ICD-10-CM | POA: Diagnosis not present

## 2023-06-26 DIAGNOSIS — I10 Essential (primary) hypertension: Secondary | ICD-10-CM | POA: Diagnosis not present

## 2023-06-26 DIAGNOSIS — Z23 Encounter for immunization: Secondary | ICD-10-CM | POA: Diagnosis not present

## 2023-06-26 DIAGNOSIS — R7303 Prediabetes: Secondary | ICD-10-CM | POA: Diagnosis not present

## 2023-07-16 DIAGNOSIS — Z01419 Encounter for gynecological examination (general) (routine) without abnormal findings: Secondary | ICD-10-CM | POA: Diagnosis not present

## 2023-07-16 DIAGNOSIS — Z681 Body mass index (BMI) 19 or less, adult: Secondary | ICD-10-CM | POA: Diagnosis not present

## 2023-07-19 IMAGING — MG MM DIGITAL SCREENING BILAT W/ TOMO AND CAD
8 series · 9 of 24 positions shown · non-contrast
Comparison: Previous exam(s).

CLINICAL DATA: Screening.

EXAM:
DIGITAL SCREENING BILATERAL MAMMOGRAM WITH TOMOSYNTHESIS AND CAD
TECHNIQUE: Bilateral screening digital craniocaudal and mediolateral oblique
mammograms were obtained. Bilateral screening digital breast
tomosynthesis was performed. The images were evaluated with
computer-aided detection.

[R CC synth-2D]
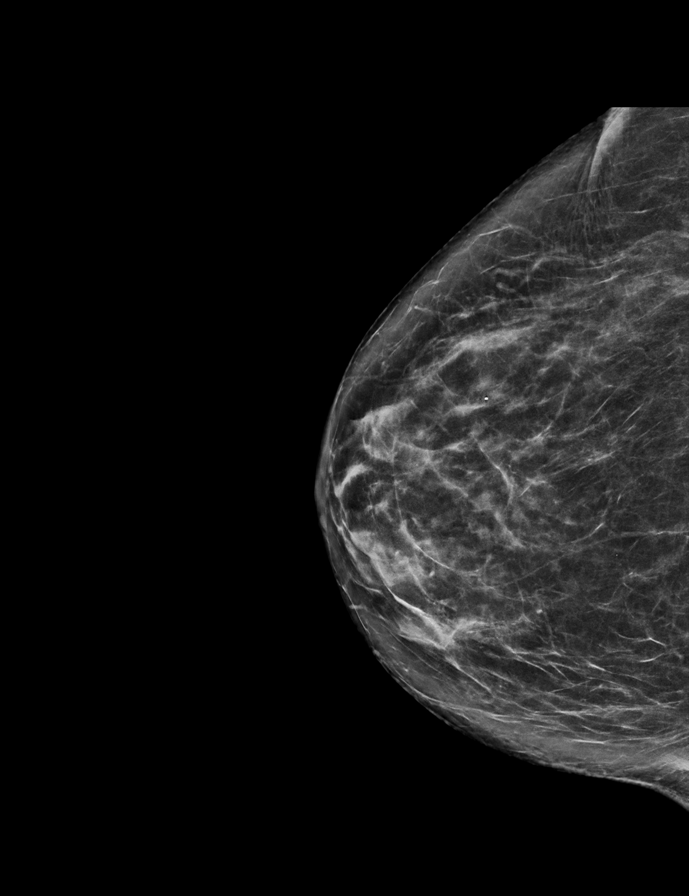

[R MLO synth-2D]
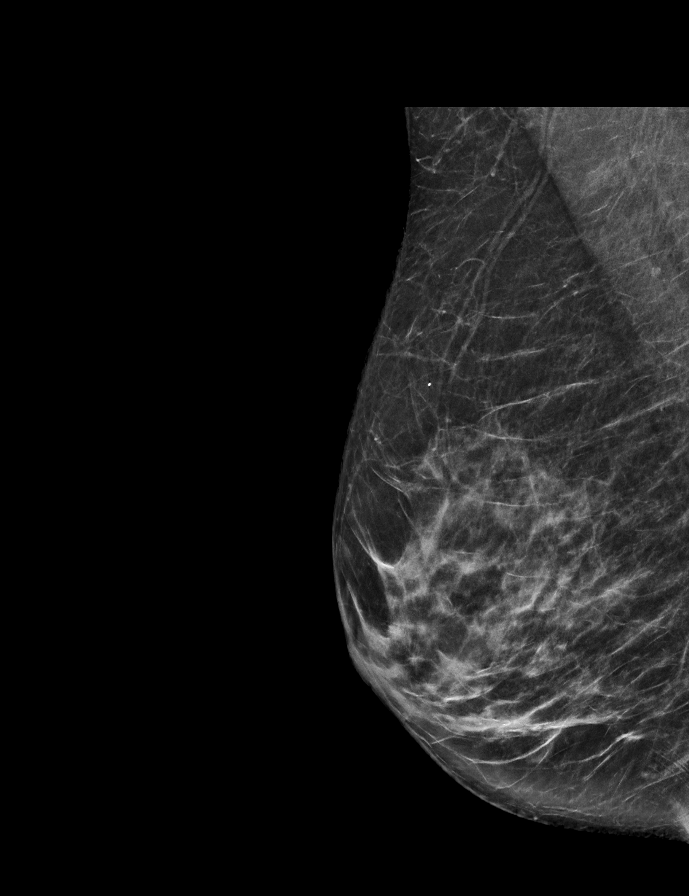

[L MLO synth-2D]
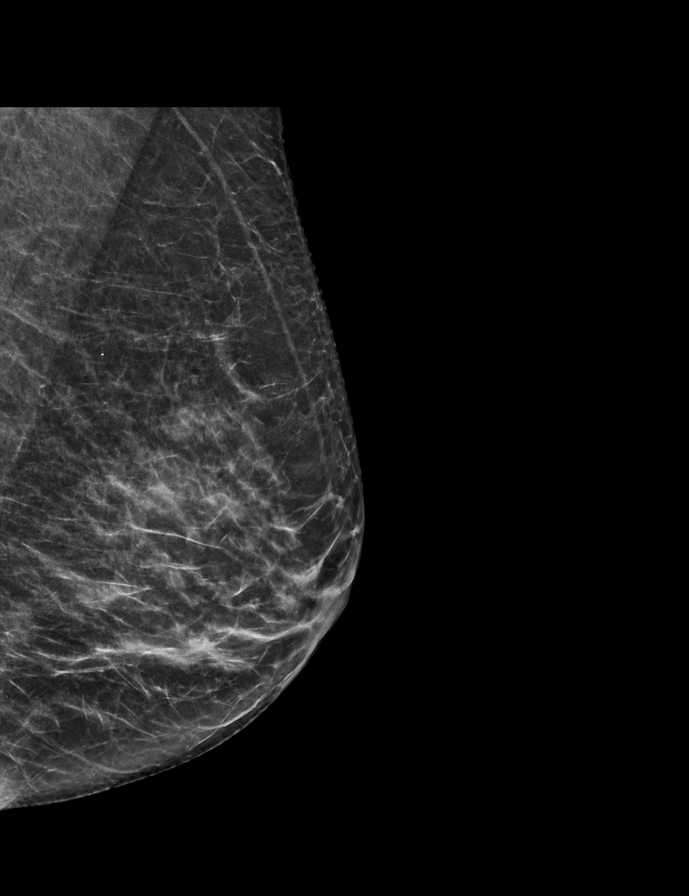

[L CC synth-2D]
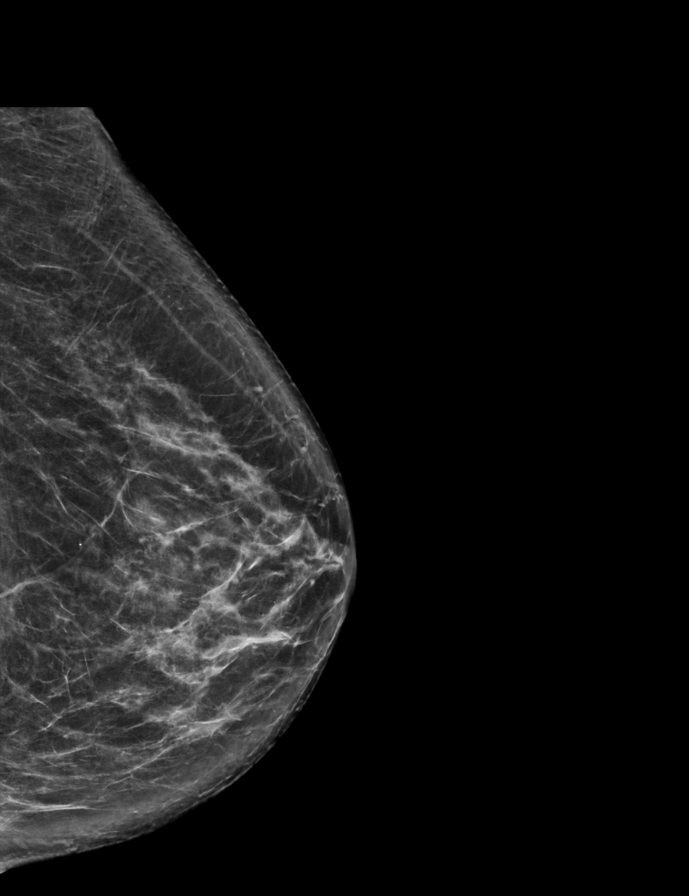

[L MLO tomo · 2 of 61 frames shown]
[frame 20/61]
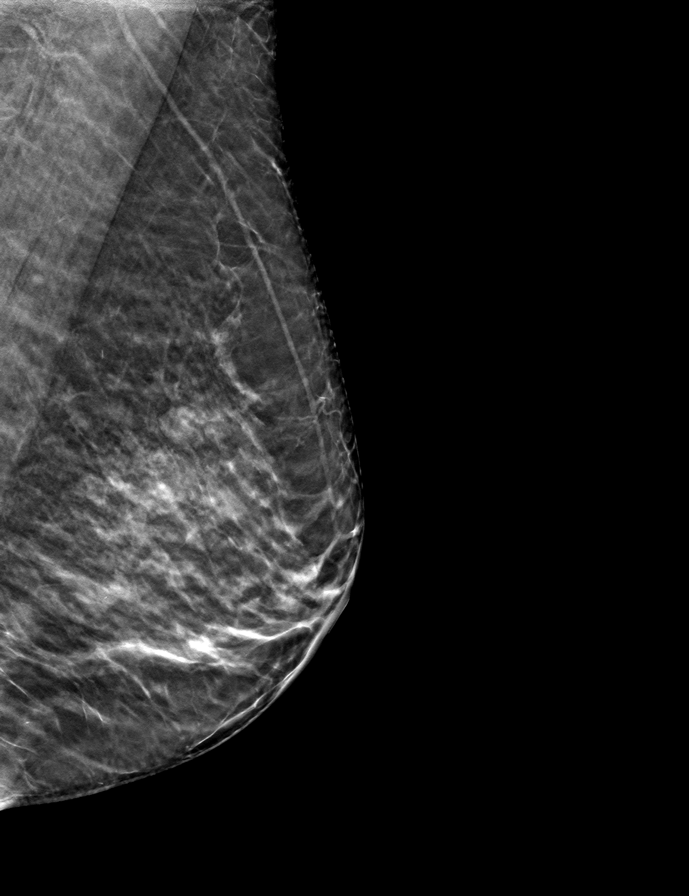
[frame 31/61]
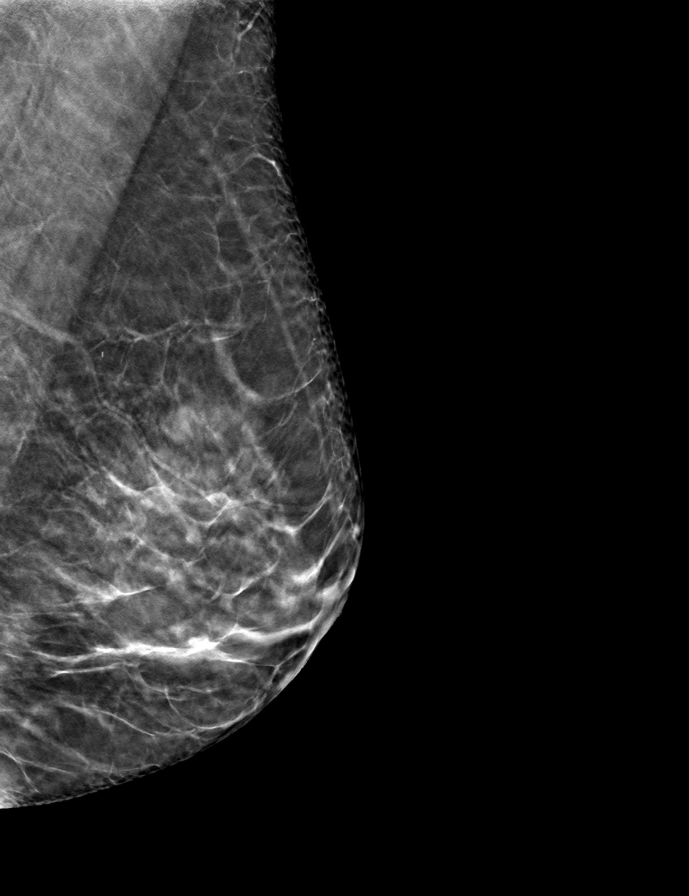

[R MLO tomo · tomo slice 33/66.0]
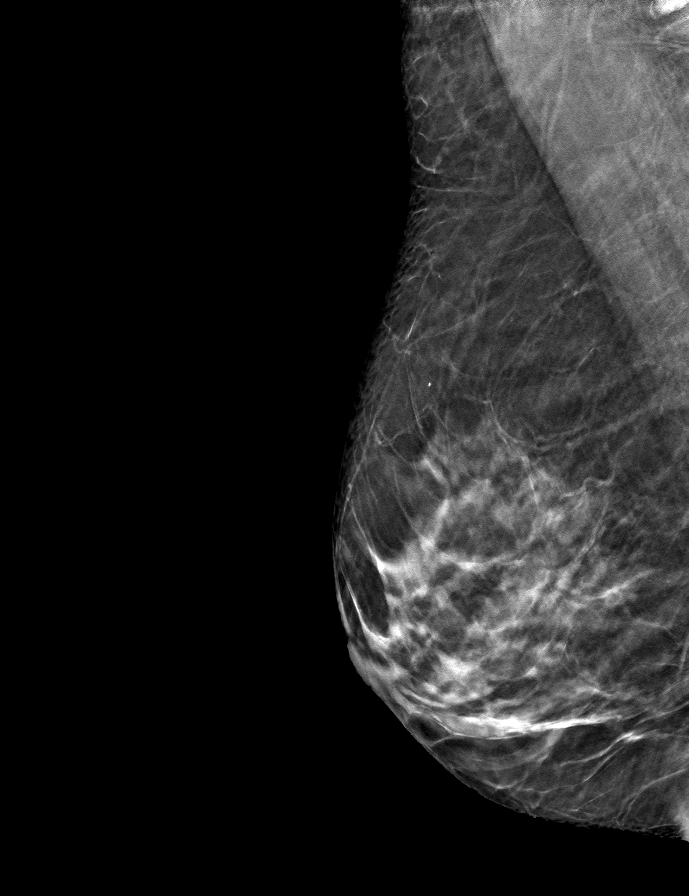

[L CC tomo · tomo slice 35/68.0]
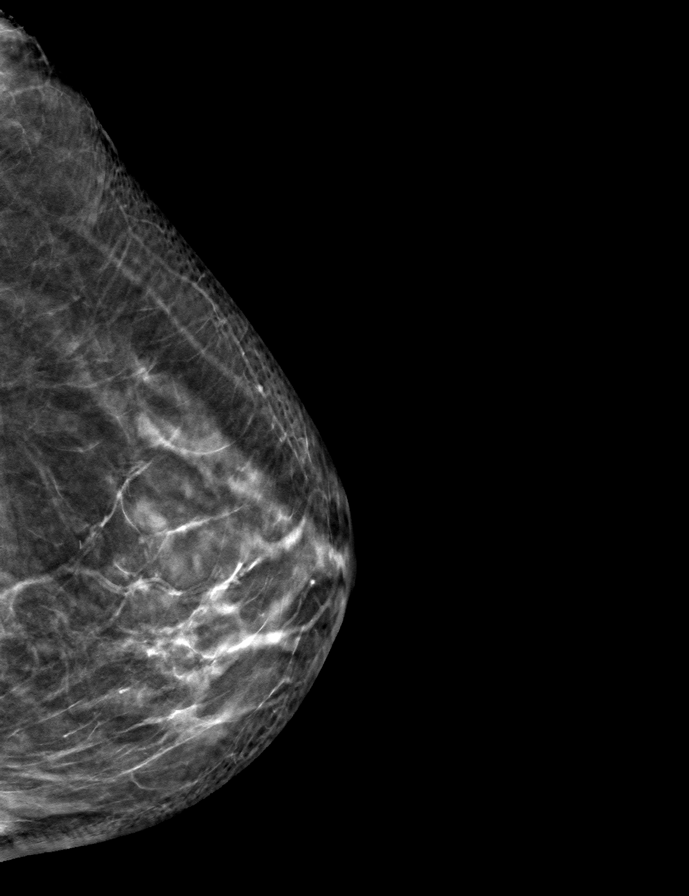

[R CC tomo · tomo slice 33/65.0]
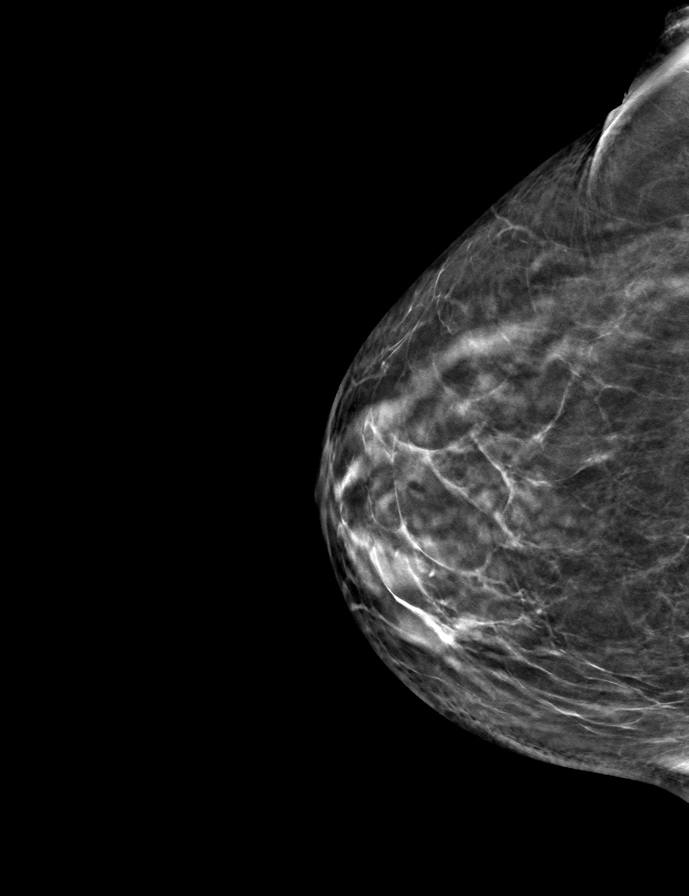

[9 of 24 positions shown; findings below may reference images not displayed]

ACR Breast Density Category c: The breast tissue is heterogeneously
dense, which may obscure small masses.
FINDINGS: In the left breast, a possible mass warrants further evaluation. In
the right breast, no findings suspicious for malignancy.
IMPRESSION: Further evaluation is suggested for a possible mass in the left
breast.

RECOMMENDATION:
Diagnostic mammogram and possibly ultrasound of the left breast.
(Code:C3-P-XXB)

The patient will be contacted regarding the findings, and additional
imaging will be scheduled.

BI-RADS CATEGORY  0: Incomplete. Need additional imaging evaluation
and/or prior mammograms for comparison.

## 2023-08-07 DIAGNOSIS — E785 Hyperlipidemia, unspecified: Secondary | ICD-10-CM | POA: Diagnosis not present

## 2023-08-07 DIAGNOSIS — Z1212 Encounter for screening for malignant neoplasm of rectum: Secondary | ICD-10-CM | POA: Diagnosis not present

## 2023-08-07 DIAGNOSIS — E221 Hyperprolactinemia: Secondary | ICD-10-CM | POA: Diagnosis not present

## 2023-08-08 DIAGNOSIS — M25811 Other specified joint disorders, right shoulder: Secondary | ICD-10-CM | POA: Diagnosis not present

## 2023-08-08 DIAGNOSIS — Z681 Body mass index (BMI) 19 or less, adult: Secondary | ICD-10-CM | POA: Diagnosis not present

## 2023-08-08 DIAGNOSIS — M7551 Bursitis of right shoulder: Secondary | ICD-10-CM | POA: Diagnosis not present

## 2023-08-08 DIAGNOSIS — R030 Elevated blood-pressure reading, without diagnosis of hypertension: Secondary | ICD-10-CM | POA: Diagnosis not present

## 2023-08-20 DIAGNOSIS — E785 Hyperlipidemia, unspecified: Secondary | ICD-10-CM | POA: Diagnosis not present

## 2023-08-20 DIAGNOSIS — K118 Other diseases of salivary glands: Secondary | ICD-10-CM | POA: Diagnosis not present

## 2023-08-20 DIAGNOSIS — I10 Essential (primary) hypertension: Secondary | ICD-10-CM | POA: Diagnosis not present

## 2023-08-20 DIAGNOSIS — R7303 Prediabetes: Secondary | ICD-10-CM | POA: Diagnosis not present

## 2023-08-20 DIAGNOSIS — M8580 Other specified disorders of bone density and structure, unspecified site: Secondary | ICD-10-CM | POA: Diagnosis not present

## 2023-08-20 DIAGNOSIS — D352 Benign neoplasm of pituitary gland: Secondary | ICD-10-CM | POA: Diagnosis not present

## 2023-08-20 DIAGNOSIS — E221 Hyperprolactinemia: Secondary | ICD-10-CM | POA: Diagnosis not present

## 2023-08-20 DIAGNOSIS — E039 Hypothyroidism, unspecified: Secondary | ICD-10-CM | POA: Diagnosis not present

## 2023-09-27 DIAGNOSIS — M7501 Adhesive capsulitis of right shoulder: Secondary | ICD-10-CM | POA: Diagnosis not present

## 2023-09-27 DIAGNOSIS — M7551 Bursitis of right shoulder: Secondary | ICD-10-CM | POA: Diagnosis not present

## 2023-09-27 DIAGNOSIS — R030 Elevated blood-pressure reading, without diagnosis of hypertension: Secondary | ICD-10-CM | POA: Diagnosis not present

## 2023-09-27 DIAGNOSIS — M25811 Other specified joint disorders, right shoulder: Secondary | ICD-10-CM | POA: Diagnosis not present

## 2023-09-27 DIAGNOSIS — Z681 Body mass index (BMI) 19 or less, adult: Secondary | ICD-10-CM | POA: Diagnosis not present

## 2023-10-08 DIAGNOSIS — M6281 Muscle weakness (generalized): Secondary | ICD-10-CM | POA: Diagnosis not present

## 2023-10-08 DIAGNOSIS — M25521 Pain in right elbow: Secondary | ICD-10-CM | POA: Diagnosis not present

## 2023-10-08 DIAGNOSIS — M25511 Pain in right shoulder: Secondary | ICD-10-CM | POA: Diagnosis not present

## 2023-10-12 DIAGNOSIS — M25511 Pain in right shoulder: Secondary | ICD-10-CM | POA: Diagnosis not present

## 2023-10-12 DIAGNOSIS — M25521 Pain in right elbow: Secondary | ICD-10-CM | POA: Diagnosis not present

## 2023-10-12 DIAGNOSIS — M6281 Muscle weakness (generalized): Secondary | ICD-10-CM | POA: Diagnosis not present

## 2023-10-16 DIAGNOSIS — M25511 Pain in right shoulder: Secondary | ICD-10-CM | POA: Diagnosis not present

## 2023-10-16 DIAGNOSIS — M25521 Pain in right elbow: Secondary | ICD-10-CM | POA: Diagnosis not present

## 2023-10-16 DIAGNOSIS — M6281 Muscle weakness (generalized): Secondary | ICD-10-CM | POA: Diagnosis not present

## 2023-10-19 DIAGNOSIS — M25521 Pain in right elbow: Secondary | ICD-10-CM | POA: Diagnosis not present

## 2023-10-19 DIAGNOSIS — M25511 Pain in right shoulder: Secondary | ICD-10-CM | POA: Diagnosis not present

## 2023-10-19 DIAGNOSIS — M6281 Muscle weakness (generalized): Secondary | ICD-10-CM | POA: Diagnosis not present

## 2023-10-23 DIAGNOSIS — M25521 Pain in right elbow: Secondary | ICD-10-CM | POA: Diagnosis not present

## 2023-10-23 DIAGNOSIS — M25511 Pain in right shoulder: Secondary | ICD-10-CM | POA: Diagnosis not present

## 2023-10-23 DIAGNOSIS — M6281 Muscle weakness (generalized): Secondary | ICD-10-CM | POA: Diagnosis not present

## 2023-10-26 DIAGNOSIS — M6281 Muscle weakness (generalized): Secondary | ICD-10-CM | POA: Diagnosis not present

## 2023-10-26 DIAGNOSIS — M25511 Pain in right shoulder: Secondary | ICD-10-CM | POA: Diagnosis not present

## 2023-10-26 DIAGNOSIS — M25521 Pain in right elbow: Secondary | ICD-10-CM | POA: Diagnosis not present

## 2023-10-29 DIAGNOSIS — M6281 Muscle weakness (generalized): Secondary | ICD-10-CM | POA: Diagnosis not present

## 2023-10-29 DIAGNOSIS — M25521 Pain in right elbow: Secondary | ICD-10-CM | POA: Diagnosis not present

## 2023-10-29 DIAGNOSIS — M25511 Pain in right shoulder: Secondary | ICD-10-CM | POA: Diagnosis not present

## 2023-11-02 DIAGNOSIS — M25521 Pain in right elbow: Secondary | ICD-10-CM | POA: Diagnosis not present

## 2023-11-02 DIAGNOSIS — M6281 Muscle weakness (generalized): Secondary | ICD-10-CM | POA: Diagnosis not present

## 2023-11-02 DIAGNOSIS — M25511 Pain in right shoulder: Secondary | ICD-10-CM | POA: Diagnosis not present

## 2023-11-05 DIAGNOSIS — M7551 Bursitis of right shoulder: Secondary | ICD-10-CM | POA: Diagnosis not present

## 2023-11-05 DIAGNOSIS — M7501 Adhesive capsulitis of right shoulder: Secondary | ICD-10-CM | POA: Diagnosis not present

## 2023-11-05 DIAGNOSIS — M25521 Pain in right elbow: Secondary | ICD-10-CM | POA: Diagnosis not present

## 2023-11-05 DIAGNOSIS — Z681 Body mass index (BMI) 19 or less, adult: Secondary | ICD-10-CM | POA: Diagnosis not present

## 2023-11-05 DIAGNOSIS — M7541 Impingement syndrome of right shoulder: Secondary | ICD-10-CM | POA: Diagnosis not present

## 2023-11-05 DIAGNOSIS — E039 Hypothyroidism, unspecified: Secondary | ICD-10-CM | POA: Diagnosis not present

## 2023-11-05 DIAGNOSIS — M6281 Muscle weakness (generalized): Secondary | ICD-10-CM | POA: Diagnosis not present

## 2023-11-05 DIAGNOSIS — M25511 Pain in right shoulder: Secondary | ICD-10-CM | POA: Diagnosis not present

## 2023-11-09 DIAGNOSIS — M25521 Pain in right elbow: Secondary | ICD-10-CM | POA: Diagnosis not present

## 2023-11-09 DIAGNOSIS — M25511 Pain in right shoulder: Secondary | ICD-10-CM | POA: Diagnosis not present

## 2023-11-09 DIAGNOSIS — M6281 Muscle weakness (generalized): Secondary | ICD-10-CM | POA: Diagnosis not present

## 2023-11-15 DIAGNOSIS — M25521 Pain in right elbow: Secondary | ICD-10-CM | POA: Diagnosis not present

## 2023-11-15 DIAGNOSIS — M25511 Pain in right shoulder: Secondary | ICD-10-CM | POA: Diagnosis not present

## 2023-11-15 DIAGNOSIS — M6281 Muscle weakness (generalized): Secondary | ICD-10-CM | POA: Diagnosis not present

## 2023-11-20 DIAGNOSIS — M25511 Pain in right shoulder: Secondary | ICD-10-CM | POA: Diagnosis not present

## 2023-11-20 DIAGNOSIS — M25521 Pain in right elbow: Secondary | ICD-10-CM | POA: Diagnosis not present

## 2023-11-20 DIAGNOSIS — M6281 Muscle weakness (generalized): Secondary | ICD-10-CM | POA: Diagnosis not present

## 2023-11-23 DIAGNOSIS — M6281 Muscle weakness (generalized): Secondary | ICD-10-CM | POA: Diagnosis not present

## 2023-11-23 DIAGNOSIS — M25521 Pain in right elbow: Secondary | ICD-10-CM | POA: Diagnosis not present

## 2023-11-23 DIAGNOSIS — M25511 Pain in right shoulder: Secondary | ICD-10-CM | POA: Diagnosis not present

## 2023-11-26 DIAGNOSIS — D225 Melanocytic nevi of trunk: Secondary | ICD-10-CM | POA: Diagnosis not present

## 2023-11-26 DIAGNOSIS — Z1283 Encounter for screening for malignant neoplasm of skin: Secondary | ICD-10-CM | POA: Diagnosis not present

## 2023-11-27 DIAGNOSIS — M25521 Pain in right elbow: Secondary | ICD-10-CM | POA: Diagnosis not present

## 2023-11-27 DIAGNOSIS — M6281 Muscle weakness (generalized): Secondary | ICD-10-CM | POA: Diagnosis not present

## 2023-11-27 DIAGNOSIS — M25511 Pain in right shoulder: Secondary | ICD-10-CM | POA: Diagnosis not present

## 2023-11-29 ENCOUNTER — Other Ambulatory Visit: Payer: Self-pay | Admitting: Obstetrics and Gynecology

## 2023-11-29 ENCOUNTER — Ambulatory Visit
Admission: RE | Admit: 2023-11-29 | Discharge: 2023-11-29 | Disposition: A | Discharge status: Home / Self Care | Source: Ambulatory Visit | Attending: Obstetrics and Gynecology | Admitting: Obstetrics and Gynecology

## 2023-11-29 DIAGNOSIS — Z0000 Encounter for general adult medical examination without abnormal findings: Secondary | ICD-10-CM

## 2023-11-29 DIAGNOSIS — Z1231 Encounter for screening mammogram for malignant neoplasm of breast: Secondary | ICD-10-CM | POA: Diagnosis not present

## 2023-11-30 DIAGNOSIS — M25511 Pain in right shoulder: Secondary | ICD-10-CM | POA: Diagnosis not present

## 2023-11-30 DIAGNOSIS — M25521 Pain in right elbow: Secondary | ICD-10-CM | POA: Diagnosis not present

## 2023-11-30 DIAGNOSIS — M6281 Muscle weakness (generalized): Secondary | ICD-10-CM | POA: Diagnosis not present

## 2023-12-05 DIAGNOSIS — M25521 Pain in right elbow: Secondary | ICD-10-CM | POA: Diagnosis not present

## 2023-12-05 DIAGNOSIS — M6281 Muscle weakness (generalized): Secondary | ICD-10-CM | POA: Diagnosis not present

## 2023-12-05 DIAGNOSIS — M25511 Pain in right shoulder: Secondary | ICD-10-CM | POA: Diagnosis not present

## 2023-12-10 DIAGNOSIS — M6281 Muscle weakness (generalized): Secondary | ICD-10-CM | POA: Diagnosis not present

## 2023-12-10 DIAGNOSIS — M25521 Pain in right elbow: Secondary | ICD-10-CM | POA: Diagnosis not present

## 2023-12-10 DIAGNOSIS — M25511 Pain in right shoulder: Secondary | ICD-10-CM | POA: Diagnosis not present

## 2023-12-13 DIAGNOSIS — M25511 Pain in right shoulder: Secondary | ICD-10-CM | POA: Diagnosis not present

## 2023-12-13 DIAGNOSIS — M25521 Pain in right elbow: Secondary | ICD-10-CM | POA: Diagnosis not present

## 2023-12-13 DIAGNOSIS — M6281 Muscle weakness (generalized): Secondary | ICD-10-CM | POA: Diagnosis not present

## 2023-12-17 DIAGNOSIS — M6281 Muscle weakness (generalized): Secondary | ICD-10-CM | POA: Diagnosis not present

## 2023-12-17 DIAGNOSIS — M25521 Pain in right elbow: Secondary | ICD-10-CM | POA: Diagnosis not present

## 2023-12-17 DIAGNOSIS — M25511 Pain in right shoulder: Secondary | ICD-10-CM | POA: Diagnosis not present

## 2023-12-20 DIAGNOSIS — M6281 Muscle weakness (generalized): Secondary | ICD-10-CM | POA: Diagnosis not present

## 2023-12-20 DIAGNOSIS — M25521 Pain in right elbow: Secondary | ICD-10-CM | POA: Diagnosis not present

## 2023-12-20 DIAGNOSIS — M25511 Pain in right shoulder: Secondary | ICD-10-CM | POA: Diagnosis not present

## 2023-12-24 DIAGNOSIS — E039 Hypothyroidism, unspecified: Secondary | ICD-10-CM | POA: Diagnosis not present

## 2023-12-24 DIAGNOSIS — Z131 Encounter for screening for diabetes mellitus: Secondary | ICD-10-CM | POA: Diagnosis not present

## 2023-12-24 DIAGNOSIS — R945 Abnormal results of liver function studies: Secondary | ICD-10-CM | POA: Diagnosis not present

## 2023-12-24 DIAGNOSIS — E559 Vitamin D deficiency, unspecified: Secondary | ICD-10-CM | POA: Diagnosis not present

## 2023-12-24 DIAGNOSIS — M25521 Pain in right elbow: Secondary | ICD-10-CM | POA: Diagnosis not present

## 2023-12-24 DIAGNOSIS — M25511 Pain in right shoulder: Secondary | ICD-10-CM | POA: Diagnosis not present

## 2023-12-24 DIAGNOSIS — Z13220 Encounter for screening for lipoid disorders: Secondary | ICD-10-CM | POA: Diagnosis not present

## 2023-12-24 DIAGNOSIS — M6281 Muscle weakness (generalized): Secondary | ICD-10-CM | POA: Diagnosis not present

## 2023-12-27 DIAGNOSIS — M25511 Pain in right shoulder: Secondary | ICD-10-CM | POA: Diagnosis not present

## 2023-12-27 DIAGNOSIS — M6281 Muscle weakness (generalized): Secondary | ICD-10-CM | POA: Diagnosis not present

## 2023-12-27 DIAGNOSIS — M25521 Pain in right elbow: Secondary | ICD-10-CM | POA: Diagnosis not present

## 2023-12-31 DIAGNOSIS — M25511 Pain in right shoulder: Secondary | ICD-10-CM | POA: Diagnosis not present

## 2023-12-31 DIAGNOSIS — Z0000 Encounter for general adult medical examination without abnormal findings: Secondary | ICD-10-CM | POA: Diagnosis not present

## 2023-12-31 DIAGNOSIS — Z1331 Encounter for screening for depression: Secondary | ICD-10-CM | POA: Diagnosis not present

## 2023-12-31 DIAGNOSIS — M25521 Pain in right elbow: Secondary | ICD-10-CM | POA: Diagnosis not present

## 2023-12-31 DIAGNOSIS — Z681 Body mass index (BMI) 19 or less, adult: Secondary | ICD-10-CM | POA: Diagnosis not present

## 2023-12-31 DIAGNOSIS — Z0001 Encounter for general adult medical examination with abnormal findings: Secondary | ICD-10-CM | POA: Diagnosis not present

## 2023-12-31 DIAGNOSIS — R7303 Prediabetes: Secondary | ICD-10-CM | POA: Diagnosis not present

## 2023-12-31 DIAGNOSIS — M6281 Muscle weakness (generalized): Secondary | ICD-10-CM | POA: Diagnosis not present

## 2023-12-31 DIAGNOSIS — I10 Essential (primary) hypertension: Secondary | ICD-10-CM | POA: Diagnosis not present

## 2023-12-31 DIAGNOSIS — E782 Mixed hyperlipidemia: Secondary | ICD-10-CM | POA: Diagnosis not present

## 2023-12-31 DIAGNOSIS — Z1389 Encounter for screening for other disorder: Secondary | ICD-10-CM | POA: Diagnosis not present

## 2024-01-03 DIAGNOSIS — M25511 Pain in right shoulder: Secondary | ICD-10-CM | POA: Diagnosis not present

## 2024-01-03 DIAGNOSIS — M6281 Muscle weakness (generalized): Secondary | ICD-10-CM | POA: Diagnosis not present

## 2024-01-03 DIAGNOSIS — M25521 Pain in right elbow: Secondary | ICD-10-CM | POA: Diagnosis not present

## 2024-01-09 DIAGNOSIS — M25521 Pain in right elbow: Secondary | ICD-10-CM | POA: Diagnosis not present

## 2024-01-09 DIAGNOSIS — M25511 Pain in right shoulder: Secondary | ICD-10-CM | POA: Diagnosis not present

## 2024-01-09 DIAGNOSIS — M6281 Muscle weakness (generalized): Secondary | ICD-10-CM | POA: Diagnosis not present

## 2024-01-14 DIAGNOSIS — M25521 Pain in right elbow: Secondary | ICD-10-CM | POA: Diagnosis not present

## 2024-01-14 DIAGNOSIS — M6281 Muscle weakness (generalized): Secondary | ICD-10-CM | POA: Diagnosis not present

## 2024-01-14 DIAGNOSIS — M25511 Pain in right shoulder: Secondary | ICD-10-CM | POA: Diagnosis not present

## 2024-01-21 DIAGNOSIS — M6281 Muscle weakness (generalized): Secondary | ICD-10-CM | POA: Diagnosis not present

## 2024-01-21 DIAGNOSIS — M25511 Pain in right shoulder: Secondary | ICD-10-CM | POA: Diagnosis not present

## 2024-01-21 DIAGNOSIS — M25521 Pain in right elbow: Secondary | ICD-10-CM | POA: Diagnosis not present

## 2024-02-07 DIAGNOSIS — H5203 Hypermetropia, bilateral: Secondary | ICD-10-CM | POA: Diagnosis not present

## 2024-02-07 DIAGNOSIS — H35032 Hypertensive retinopathy, left eye: Secondary | ICD-10-CM | POA: Diagnosis not present

## 2024-07-07 DIAGNOSIS — Z0001 Encounter for general adult medical examination with abnormal findings: Secondary | ICD-10-CM | POA: Diagnosis not present

## 2024-07-07 DIAGNOSIS — R945 Abnormal results of liver function studies: Secondary | ICD-10-CM | POA: Diagnosis not present

## 2024-07-07 DIAGNOSIS — I10 Essential (primary) hypertension: Secondary | ICD-10-CM | POA: Diagnosis not present

## 2024-07-07 DIAGNOSIS — R7303 Prediabetes: Secondary | ICD-10-CM | POA: Diagnosis not present

## 2024-07-07 DIAGNOSIS — E7849 Other hyperlipidemia: Secondary | ICD-10-CM | POA: Diagnosis not present

## 2024-07-07 DIAGNOSIS — E039 Hypothyroidism, unspecified: Secondary | ICD-10-CM | POA: Diagnosis not present

## 2024-07-14 DIAGNOSIS — I10 Essential (primary) hypertension: Secondary | ICD-10-CM | POA: Diagnosis not present

## 2024-07-14 DIAGNOSIS — R7303 Prediabetes: Secondary | ICD-10-CM | POA: Diagnosis not present

## 2024-07-14 DIAGNOSIS — Z681 Body mass index (BMI) 19 or less, adult: Secondary | ICD-10-CM | POA: Diagnosis not present

## 2024-07-14 DIAGNOSIS — Z23 Encounter for immunization: Secondary | ICD-10-CM | POA: Diagnosis not present

## 2024-08-04 DIAGNOSIS — Z01419 Encounter for gynecological examination (general) (routine) without abnormal findings: Secondary | ICD-10-CM | POA: Diagnosis not present

## 2024-08-04 DIAGNOSIS — Z681 Body mass index (BMI) 19 or less, adult: Secondary | ICD-10-CM | POA: Diagnosis not present

## 2024-08-04 DIAGNOSIS — N958 Other specified menopausal and perimenopausal disorders: Secondary | ICD-10-CM | POA: Diagnosis not present

## 2024-08-04 DIAGNOSIS — M8588 Other specified disorders of bone density and structure, other site: Secondary | ICD-10-CM | POA: Diagnosis not present

## 2024-08-18 DIAGNOSIS — D352 Benign neoplasm of pituitary gland: Secondary | ICD-10-CM | POA: Diagnosis not present

## 2024-10-24 ENCOUNTER — Other Ambulatory Visit: Payer: Self-pay | Admitting: Obstetrics and Gynecology

## 2024-10-24 ENCOUNTER — Encounter: Payer: Self-pay | Admitting: Obstetrics and Gynecology

## 2024-10-24 DIAGNOSIS — Z1231 Encounter for screening mammogram for malignant neoplasm of breast: Secondary | ICD-10-CM

## 2024-12-05 ENCOUNTER — Ambulatory Visit
